# Patient Record
Sex: Female | Born: 1975 | Race: White | Hispanic: No | Smoking: Current every day smoker
Health system: Southern US, Community
[De-identification: ages and names within clinical notes are randomized; demographics above are authoritative.]

## PROBLEM LIST (undated history)

## (undated) DIAGNOSIS — E039 Hypothyroidism, unspecified: Secondary | ICD-10-CM

## (undated) DIAGNOSIS — E7841 Elevated Lipoprotein(a): Secondary | ICD-10-CM

## (undated) DIAGNOSIS — E785 Hyperlipidemia, unspecified: Secondary | ICD-10-CM

## (undated) DIAGNOSIS — F1910 Other psychoactive substance abuse, uncomplicated: Secondary | ICD-10-CM

## (undated) DIAGNOSIS — I213 ST elevation (STEMI) myocardial infarction of unspecified site: Secondary | ICD-10-CM

## (undated) DIAGNOSIS — I2510 Atherosclerotic heart disease of native coronary artery without angina pectoris: Secondary | ICD-10-CM

## (undated) DIAGNOSIS — I10 Essential (primary) hypertension: Secondary | ICD-10-CM

## (undated) DIAGNOSIS — I5022 Chronic systolic (congestive) heart failure: Secondary | ICD-10-CM

## (undated) DIAGNOSIS — N1831 Chronic kidney disease, stage 3a: Secondary | ICD-10-CM

---

## 2021-04-29 ENCOUNTER — Encounter (HOSPITAL_COMMUNITY): Payer: Self-pay | Admitting: Cardiovascular Disease

## 2021-04-29 ENCOUNTER — Inpatient Hospital Stay (HOSPITAL_COMMUNITY)
Admission: AD | Admit: 2021-04-29 | Discharge: 2021-05-02 | DRG: 280 | Disposition: A | Discharge status: Home / Self Care | Payer: Medicaid Other | Attending: Cardiovascular Disease | Admitting: Cardiovascular Disease

## 2021-04-29 ENCOUNTER — Emergency Department (HOSPITAL_COMMUNITY): Payer: Medicaid Other

## 2021-04-29 ENCOUNTER — Inpatient Hospital Stay (HOSPITAL_COMMUNITY): Payer: Medicaid Other

## 2021-04-29 ENCOUNTER — Other Ambulatory Visit: Payer: Self-pay

## 2021-04-29 ENCOUNTER — Other Ambulatory Visit (HOSPITAL_COMMUNITY): Payer: Self-pay

## 2021-04-29 ENCOUNTER — Encounter (HOSPITAL_COMMUNITY): Payer: Medicaid Other

## 2021-04-29 ENCOUNTER — Inpatient Hospital Stay (HOSPITAL_COMMUNITY)
Admission: AD | Disposition: A | Discharge status: Home / Self Care | Payer: Self-pay | Source: Home / Self Care | Attending: Cardiovascular Disease

## 2021-04-29 DIAGNOSIS — I214 Non-ST elevation (NSTEMI) myocardial infarction: Secondary | ICD-10-CM

## 2021-04-29 DIAGNOSIS — I161 Hypertensive emergency: Secondary | ICD-10-CM

## 2021-04-29 DIAGNOSIS — I255 Ischemic cardiomyopathy: Secondary | ICD-10-CM | POA: Diagnosis present

## 2021-04-29 DIAGNOSIS — I5030 Unspecified diastolic (congestive) heart failure: Secondary | ICD-10-CM

## 2021-04-29 DIAGNOSIS — I517 Cardiomegaly: Secondary | ICD-10-CM

## 2021-04-29 DIAGNOSIS — F1210 Cannabis abuse, uncomplicated: Secondary | ICD-10-CM | POA: Diagnosis present

## 2021-04-29 DIAGNOSIS — I5043 Acute on chronic combined systolic (congestive) and diastolic (congestive) heart failure: Secondary | ICD-10-CM

## 2021-04-29 DIAGNOSIS — I340 Nonrheumatic mitral (valve) insufficiency: Secondary | ICD-10-CM | POA: Diagnosis not present

## 2021-04-29 DIAGNOSIS — I169 Hypertensive crisis, unspecified: Secondary | ICD-10-CM | POA: Diagnosis not present

## 2021-04-29 DIAGNOSIS — Z8711 Personal history of peptic ulcer disease: Secondary | ICD-10-CM

## 2021-04-29 DIAGNOSIS — I213 ST elevation (STEMI) myocardial infarction of unspecified site: Secondary | ICD-10-CM | POA: Diagnosis present

## 2021-04-29 DIAGNOSIS — I5022 Chronic systolic (congestive) heart failure: Secondary | ICD-10-CM

## 2021-04-29 DIAGNOSIS — Z91040 Latex allergy status: Secondary | ICD-10-CM

## 2021-04-29 DIAGNOSIS — I2510 Atherosclerotic heart disease of native coronary artery without angina pectoris: Secondary | ICD-10-CM | POA: Diagnosis present

## 2021-04-29 DIAGNOSIS — N1831 Chronic kidney disease, stage 3a: Secondary | ICD-10-CM | POA: Diagnosis present

## 2021-04-29 DIAGNOSIS — Z20822 Contact with and (suspected) exposure to covid-19: Secondary | ICD-10-CM | POA: Diagnosis present

## 2021-04-29 DIAGNOSIS — E875 Hyperkalemia: Secondary | ICD-10-CM | POA: Diagnosis present

## 2021-04-29 DIAGNOSIS — F1910 Other psychoactive substance abuse, uncomplicated: Secondary | ICD-10-CM | POA: Diagnosis not present

## 2021-04-29 DIAGNOSIS — I5189 Other ill-defined heart diseases: Secondary | ICD-10-CM | POA: Diagnosis not present

## 2021-04-29 DIAGNOSIS — E785 Hyperlipidemia, unspecified: Secondary | ICD-10-CM

## 2021-04-29 DIAGNOSIS — Z880 Allergy status to penicillin: Secondary | ICD-10-CM | POA: Diagnosis not present

## 2021-04-29 DIAGNOSIS — F1310 Sedative, hypnotic or anxiolytic abuse, uncomplicated: Secondary | ICD-10-CM | POA: Diagnosis present

## 2021-04-29 DIAGNOSIS — F17200 Nicotine dependence, unspecified, uncomplicated: Secondary | ICD-10-CM | POA: Diagnosis present

## 2021-04-29 DIAGNOSIS — I5023 Acute on chronic systolic (congestive) heart failure: Secondary | ICD-10-CM | POA: Diagnosis present

## 2021-04-29 DIAGNOSIS — I10 Essential (primary) hypertension: Secondary | ICD-10-CM

## 2021-04-29 DIAGNOSIS — I160 Hypertensive urgency: Secondary | ICD-10-CM | POA: Diagnosis not present

## 2021-04-29 DIAGNOSIS — Z882 Allergy status to sulfonamides status: Secondary | ICD-10-CM | POA: Diagnosis not present

## 2021-04-29 DIAGNOSIS — E039 Hypothyroidism, unspecified: Secondary | ICD-10-CM

## 2021-04-29 DIAGNOSIS — Z6841 Body Mass Index (BMI) 40.0 and over, adult: Secondary | ICD-10-CM

## 2021-04-29 DIAGNOSIS — I130 Hypertensive heart and chronic kidney disease with heart failure and stage 1 through stage 4 chronic kidney disease, or unspecified chronic kidney disease: Secondary | ICD-10-CM | POA: Diagnosis present

## 2021-04-29 DIAGNOSIS — F1510 Other stimulant abuse, uncomplicated: Secondary | ICD-10-CM | POA: Diagnosis present

## 2021-04-29 DIAGNOSIS — I2129 ST elevation (STEMI) myocardial infarction involving other sites: Principal | ICD-10-CM

## 2021-04-29 DIAGNOSIS — Z8249 Family history of ischemic heart disease and other diseases of the circulatory system: Secondary | ICD-10-CM | POA: Diagnosis not present

## 2021-04-29 DIAGNOSIS — Z720 Tobacco use: Secondary | ICD-10-CM | POA: Diagnosis not present

## 2021-04-29 HISTORY — DX: Elevated lipoprotein(a): E78.41

## 2021-04-29 HISTORY — DX: Essential (primary) hypertension: I10

## 2021-04-29 HISTORY — DX: Hypothyroidism, unspecified: E03.9

## 2021-04-29 HISTORY — DX: ST elevation (STEMI) myocardial infarction of unspecified site: I21.3

## 2021-04-29 HISTORY — DX: Hyperlipidemia, unspecified: E78.5

## 2021-04-29 HISTORY — DX: Chronic systolic (congestive) heart failure: I50.22

## 2021-04-29 HISTORY — DX: Chronic kidney disease, stage 3a: N18.31

## 2021-04-29 HISTORY — DX: Morbid (severe) obesity due to excess calories: E66.01

## 2021-04-29 HISTORY — DX: Other psychoactive substance abuse, uncomplicated: F19.10

## 2021-04-29 HISTORY — DX: Atherosclerotic heart disease of native coronary artery without angina pectoris: I25.10

## 2021-04-29 HISTORY — PX: LEFT HEART CATH AND CORONARY ANGIOGRAPHY: CATH118249

## 2021-04-29 LAB — COMPREHENSIVE METABOLIC PANEL
ALT: 10 U/L (ref 0–44)
AST: 18 U/L (ref 15–41)
Albumin: 3.8 g/dL (ref 3.5–5.0)
Alkaline Phosphatase: 74 U/L (ref 38–126)
Anion gap: 10 (ref 5–15)
BUN: 14 mg/dL (ref 6–20)
CO2: 22 mmol/L (ref 22–32)
Calcium: 8.8 mg/dL — ABNORMAL LOW (ref 8.9–10.3)
Chloride: 106 mmol/L (ref 98–111)
Creatinine, Ser: 1.26 mg/dL — ABNORMAL HIGH (ref 0.44–1.00)
GFR, Estimated: 54 mL/min — ABNORMAL LOW (ref >60)
Glucose, Bld: 117 mg/dL — ABNORMAL HIGH (ref 70–99)
Potassium: 3.6 mmol/L (ref 3.5–5.1)
Sodium: 138 mmol/L (ref 135–145)
Total Bilirubin: 0.7 mg/dL (ref 0.3–1.2)
Total Protein: 7.1 g/dL (ref 6.5–8.1)

## 2021-04-29 LAB — CBC WITH DIFFERENTIAL/PLATELET
Abs Immature Granulocytes: 0.06 10*3/uL (ref 0.00–0.07)
Basophils Absolute: 0.1 10*3/uL (ref 0.0–0.1)
Basophils Relative: 1 %
Eosinophils Absolute: 0.2 10*3/uL (ref 0.0–0.5)
Eosinophils Relative: 2 %
HCT: 35.7 % — ABNORMAL LOW (ref 36.0–46.0)
Hemoglobin: 11.2 g/dL — ABNORMAL LOW (ref 12.0–15.0)
Immature Granulocytes: 1 %
Lymphocytes Relative: 13 %
Lymphs Abs: 1.6 10*3/uL (ref 0.7–4.0)
MCH: 27.3 pg (ref 26.0–34.0)
MCHC: 31.4 g/dL (ref 30.0–36.0)
MCV: 86.9 fL (ref 80.0–100.0)
Monocytes Absolute: 0.6 10*3/uL (ref 0.1–1.0)
Monocytes Relative: 5 %
Neutro Abs: 10.3 10*3/uL — ABNORMAL HIGH (ref 1.7–7.7)
Neutrophils Relative %: 78 %
Platelets: 322 10*3/uL (ref 150–400)
RBC: 4.11 MIL/uL (ref 3.87–5.11)
RDW: 16.4 % — ABNORMAL HIGH (ref 11.5–15.5)
WBC: 12.8 10*3/uL — ABNORMAL HIGH (ref 4.0–10.5)
nRBC: 0 % (ref 0.0–0.2)

## 2021-04-29 LAB — ECHOCARDIOGRAM COMPLETE
AR max vel: 3.29 cm2
AV Area VTI: 2.68 cm2
AV Area mean vel: 3.04 cm2
AV Mean grad: 3 mmHg
AV Peak grad: 5.1 mmHg
Ao pk vel: 1.13 m/s
Area-P 1/2: 4.83 cm2
Calc EF: 42.8 %
Height: 65 in
MV M vel: 6.24 m/s
MV Peak grad: 155.8 mmHg
MV VTI: 2.34 cm2
Radius: 0.4 cm
S' Lateral: 3.6 cm
Single Plane A2C EF: 39.2 %
Single Plane A4C EF: 41.6 %
Weight: 4000.03 oz

## 2021-04-29 LAB — RESP PANEL BY RT-PCR (FLU A&B, COVID) ARPGX2
Influenza A by PCR: NEGATIVE
Influenza B by PCR: NEGATIVE
SARS Coronavirus 2 by RT PCR: NEGATIVE

## 2021-04-29 LAB — BASIC METABOLIC PANEL
Anion gap: 11 (ref 5–15)
BUN: 14 mg/dL (ref 6–20)
CO2: 21 mmol/L — ABNORMAL LOW (ref 22–32)
Calcium: 9 mg/dL (ref 8.9–10.3)
Chloride: 104 mmol/L (ref 98–111)
Creatinine, Ser: 1.14 mg/dL — ABNORMAL HIGH (ref 0.44–1.00)
GFR, Estimated: >60 mL/min (ref >60)
Glucose, Bld: 93 mg/dL (ref 70–99)
Potassium: 4.2 mmol/L (ref 3.5–5.1)
Sodium: 136 mmol/L (ref 135–145)

## 2021-04-29 LAB — I-STAT BETA HCG BLOOD, ED (MC, WL, AP ONLY): I-stat hCG, quantitative: <5 m[IU]/mL (ref <5)

## 2021-04-29 LAB — LIPID PANEL
Cholesterol: 253 mg/dL — ABNORMAL HIGH (ref 0–200)
HDL: 62 mg/dL (ref >40)
LDL Cholesterol: 170 mg/dL — ABNORMAL HIGH (ref 0–99)
Total CHOL/HDL Ratio: 4.1 RATIO
Triglycerides: 107 mg/dL (ref <150)
VLDL: 21 mg/dL (ref 0–40)

## 2021-04-29 LAB — RAPID URINE DRUG SCREEN, HOSP PERFORMED
Amphetamines: POSITIVE — AB
Barbiturates: NOT DETECTED
Benzodiazepines: POSITIVE — AB
Cocaine: NOT DETECTED
Opiates: NOT DETECTED
Tetrahydrocannabinol: POSITIVE — AB

## 2021-04-29 LAB — HEMOGLOBIN A1C
Hgb A1c MFr Bld: 5.4 % (ref 4.8–5.6)
Mean Plasma Glucose: 108.28 mg/dL

## 2021-04-29 LAB — MRSA NEXT GEN BY PCR, NASAL: MRSA by PCR Next Gen: NOT DETECTED

## 2021-04-29 LAB — TROPONIN I (HIGH SENSITIVITY)
Troponin I (High Sensitivity): 17 ng/L (ref <18)
Troponin I (High Sensitivity): 330 ng/L (ref <18)
Troponin I (High Sensitivity): 597 ng/L (ref <18)
Troponin I (High Sensitivity): 1559 ng/L (ref <18)

## 2021-04-29 LAB — TSH: TSH: 120.126 u[IU]/mL — ABNORMAL HIGH (ref 0.350–4.500)

## 2021-04-29 LAB — POCT ACTIVATED CLOTTING TIME: Activated Clotting Time: 275 seconds

## 2021-04-29 LAB — MAGNESIUM: Magnesium: 2.2 mg/dL (ref 1.7–2.4)

## 2021-04-29 LAB — APTT: aPTT: 32 seconds (ref 24–36)

## 2021-04-29 LAB — PROTIME-INR
INR: 1 (ref 0.8–1.2)
Prothrombin Time: 13.2 seconds (ref 11.4–15.2)

## 2021-04-29 SURGERY — LEFT HEART CATH AND CORONARY ANGIOGRAPHY
Anesthesia: LOCAL

## 2021-04-29 MED ORDER — TICAGRELOR 90 MG PO TABS: 90.0000 mg | ORAL_TABLET | Freq: Two times a day (BID) | ORAL | Status: DC

## 2021-04-29 MED ORDER — LABETALOL HCL 5 MG/ML IV SOLN
10.0000 mg | INTRAVENOUS | Status: AC | PRN
  Filled 2021-04-29: qty 4

## 2021-04-29 MED ORDER — HEPARIN (PORCINE) IN NACL 1000-0.9 UT/500ML-% IV SOLN
INTRAVENOUS | Status: AC
  Filled 2021-04-29: qty 500

## 2021-04-29 MED ORDER — SODIUM CHLORIDE 0.9 % IV SOLN: INTRAVENOUS | Status: AC

## 2021-04-29 MED ORDER — SODIUM CHLORIDE 0.9% FLUSH: 3.0000 mL | INTRAVENOUS | Status: DC | PRN

## 2021-04-29 MED ORDER — VERAPAMIL HCL 2.5 MG/ML IV SOLN
INTRAVENOUS | Status: AC
  Filled 2021-04-29: qty 2

## 2021-04-29 MED ORDER — SODIUM CHLORIDE 0.9 % IV SOLN: INTRAVENOUS | Status: DC

## 2021-04-29 MED ORDER — ATORVASTATIN CALCIUM 80 MG PO TABS: 80.0000 mg | ORAL_TABLET | Freq: Every day | ORAL | Status: DC

## 2021-04-29 MED ORDER — NITROGLYCERIN IN D5W 200-5 MCG/ML-% IV SOLN
INTRAVENOUS | Status: AC
  Filled 2021-04-29: qty 250

## 2021-04-29 MED ORDER — FUROSEMIDE 10 MG/ML IJ SOLN
40.0000 mg | Freq: Once | INTRAMUSCULAR | Status: AC
  Administered 2021-04-29: 40 mg via INTRAVENOUS
  Filled 2021-04-29: qty 4

## 2021-04-29 MED ORDER — FENTANYL CITRATE (PF) 100 MCG/2ML IJ SOLN
INTRAMUSCULAR | Status: DC | PRN
  Administered 2021-04-29: 50 ug via INTRAVENOUS

## 2021-04-29 MED ORDER — FUROSEMIDE 10 MG/ML IJ SOLN: 40.0000 mg | Freq: Once | INTRAMUSCULAR | Status: DC

## 2021-04-29 MED ORDER — HEPARIN SODIUM (PORCINE) 5000 UNIT/ML IJ SOLN
4000.0000 [IU] | Freq: Once | INTRAMUSCULAR | Status: AC
  Administered 2021-04-29: 4000 [IU] via INTRAVENOUS
  Filled 2021-04-29: qty 1

## 2021-04-29 MED ORDER — FENTANYL CITRATE (PF) 100 MCG/2ML IJ SOLN
INTRAMUSCULAR | Status: AC
  Filled 2021-04-29: qty 2

## 2021-04-29 MED ORDER — HYDRALAZINE HCL 20 MG/ML IJ SOLN
10.0000 mg | INTRAMUSCULAR | Status: AC | PRN
  Filled 2021-04-29: qty 1

## 2021-04-29 MED ORDER — METOPROLOL TARTRATE 12.5 MG HALF TABLET: 25.0000 mg | ORAL_TABLET | Freq: Two times a day (BID) | ORAL | Status: DC

## 2021-04-29 MED ORDER — SODIUM CHLORIDE 0.9 % IV SOLN: 250.0000 mL | INTRAVENOUS | Status: DC | PRN

## 2021-04-29 MED ORDER — HEPARIN (PORCINE) IN NACL 1000-0.9 UT/500ML-% IV SOLN
INTRAVENOUS | Status: DC | PRN
  Administered 2021-04-29 (×2): 500 mL

## 2021-04-29 MED ORDER — NITROGLYCERIN 0.4 MG SL SUBL: 0.4000 mg | SUBLINGUAL_TABLET | SUBLINGUAL | Status: DC | PRN

## 2021-04-29 MED ORDER — CHLORHEXIDINE GLUCONATE CLOTH 2 % EX PADS
6.0000 | MEDICATED_PAD | Freq: Every day | CUTANEOUS | Status: DC
  Administered 2021-04-29 – 2021-04-30 (×2): 6 via TOPICAL

## 2021-04-29 MED ORDER — LOSARTAN POTASSIUM 25 MG PO TABS
25.0000 mg | ORAL_TABLET | Freq: Every day | ORAL | Status: DC
  Administered 2021-04-29 – 2021-04-30 (×2): 25 mg via ORAL
  Filled 2021-04-29 (×2): qty 1

## 2021-04-29 MED ORDER — NITROGLYCERIN IN D5W 200-5 MCG/ML-% IV SOLN
0.0000 ug/min | INTRAVENOUS | Status: DC
  Administered 2021-04-29: 20 ug/min via INTRAVENOUS

## 2021-04-29 MED ORDER — TICAGRELOR 90 MG PO TABS
ORAL_TABLET | ORAL | Status: DC | PRN
  Administered 2021-04-29: 180 mg via ORAL

## 2021-04-29 MED ORDER — CLOPIDOGREL BISULFATE 75 MG PO TABS
75.0000 mg | ORAL_TABLET | Freq: Every day | ORAL | Status: DC
Start: 2021-04-29 — End: 2021-04-29

## 2021-04-29 MED ORDER — CARVEDILOL 12.5 MG PO TABS
12.5000 mg | ORAL_TABLET | Freq: Once | ORAL | Status: AC
  Administered 2021-04-29: 12.5 mg via ORAL
  Filled 2021-04-29: qty 1

## 2021-04-29 MED ORDER — MIDAZOLAM HCL 2 MG/2ML IJ SOLN
INTRAMUSCULAR | Status: DC | PRN
Start: 2021-04-29 — End: 2021-04-29
  Administered 2021-04-29: 2 mg via INTRAVENOUS

## 2021-04-29 MED ORDER — LIDOCAINE HCL (PF) 1 % IJ SOLN
INTRAMUSCULAR | Status: AC
  Filled 2021-04-29: qty 30

## 2021-04-29 MED ORDER — ROSUVASTATIN CALCIUM 20 MG PO TABS
40.0000 mg | ORAL_TABLET | Freq: Every day | ORAL | Status: DC
  Administered 2021-04-29 – 2021-05-02 (×4): 40 mg via ORAL
  Filled 2021-04-29 (×4): qty 2

## 2021-04-29 MED ORDER — ONDANSETRON HCL 4 MG/2ML IJ SOLN
4.0000 mg | Freq: Four times a day (QID) | INTRAMUSCULAR | Status: DC | PRN
  Administered 2021-04-29: 4 mg via INTRAVENOUS
  Filled 2021-04-29: qty 2

## 2021-04-29 MED ORDER — CLOPIDOGREL BISULFATE 75 MG PO TABS
75.0000 mg | ORAL_TABLET | Freq: Every day | ORAL | Status: DC
  Administered 2021-04-29 – 2021-05-02 (×4): 75 mg via ORAL
  Filled 2021-04-29 (×4): qty 1

## 2021-04-29 MED ORDER — SODIUM CHLORIDE 0.9% FLUSH
3.0000 mL | Freq: Two times a day (BID) | INTRAVENOUS | Status: DC
  Administered 2021-04-29 – 2021-05-01 (×4): 3 mL via INTRAVENOUS

## 2021-04-29 MED ORDER — ASPIRIN 81 MG PO CHEW
81.0000 mg | CHEWABLE_TABLET | Freq: Every day | ORAL | Status: DC
  Administered 2021-04-30 – 2021-05-02 (×3): 81 mg via ORAL
  Filled 2021-04-29 (×3): qty 1

## 2021-04-29 MED ORDER — NITROGLYCERIN IN D5W 200-5 MCG/ML-% IV SOLN
INTRAVENOUS | Status: AC | PRN
  Administered 2021-04-29: 20 ug/min via INTRAVENOUS

## 2021-04-29 MED ORDER — ACETAMINOPHEN 325 MG PO TABS
650.0000 mg | ORAL_TABLET | ORAL | Status: DC | PRN
  Administered 2021-04-29 – 2021-05-02 (×3): 650 mg via ORAL
  Filled 2021-04-29 (×3): qty 2

## 2021-04-29 MED ORDER — CARVEDILOL 12.5 MG PO TABS
12.5000 mg | ORAL_TABLET | Freq: Two times a day (BID) | ORAL | Status: DC
  Administered 2021-04-29 – 2021-04-30 (×4): 12.5 mg via ORAL
  Filled 2021-04-29 (×5): qty 1

## 2021-04-29 MED ORDER — TICAGRELOR 90 MG PO TABS
ORAL_TABLET | ORAL | Status: AC
  Filled 2021-04-29: qty 2

## 2021-04-29 MED ORDER — HEPARIN SODIUM (PORCINE) 1000 UNIT/ML IJ SOLN
INTRAMUSCULAR | Status: AC
  Filled 2021-04-29: qty 10

## 2021-04-29 MED ORDER — MIDAZOLAM HCL 2 MG/2ML IJ SOLN
INTRAMUSCULAR | Status: AC
Start: 2021-04-29 — End: ?
  Filled 2021-04-29: qty 2

## 2021-04-29 MED ORDER — HEPARIN SODIUM (PORCINE) 1000 UNIT/ML IJ SOLN
INTRAMUSCULAR | Status: DC | PRN
  Administered 2021-04-29: 8000 [IU] via INTRAVENOUS

## 2021-04-29 MED ORDER — ASPIRIN 81 MG PO CHEW
324.0000 mg | CHEWABLE_TABLET | Freq: Once | ORAL | Status: AC
  Administered 2021-04-29: 162 mg via ORAL
  Filled 2021-04-29: qty 4

## 2021-04-29 SURGICAL SUPPLY — 12 items
BAND ZEPHYR COMPRESS 30 LONG (HEMOSTASIS) ×1 IMPLANT
CATH 5FR JL3.5 JR4 ANG PIG MP (CATHETERS) ×1 IMPLANT
CATH VISTA GUIDE 6FR XB3 (CATHETERS) ×1 IMPLANT
GLIDESHEATH SLEND SS 6F .021 (SHEATH) ×1 IMPLANT
GUIDEWIRE INQWIRE 1.5J.035X260 (WIRE) IMPLANT
INQWIRE 1.5J .035X260CM (WIRE) ×2
KIT ENCORE 26 ADVANTAGE (KITS) ×1 IMPLANT
KIT HEART LEFT (KITS) ×2 IMPLANT
PACK CARDIAC CATHETERIZATION (CUSTOM PROCEDURE TRAY) ×2 IMPLANT
TRANSDUCER W/STOPCOCK (MISCELLANEOUS) ×2 IMPLANT
TUBING CIL FLEX 10 FLL-RA (TUBING) ×2 IMPLANT
VALVE COPILOT STAT (MISCELLANEOUS) ×1 IMPLANT

## 2021-04-29 NOTE — ED Triage Notes (Signed)
Pt arrived via [facility] from home with c/c of CP. Per EMS Chest Pain started 2 hours ago and couldn't sleep due to pain. Pt taken 2 ASA and given 4 nitro and 4mg  zofran. Deneis releif with nitro. Describes chest pain radiating from chest to back ? ?188/100, 96% 4L, 59HR ?

## 2021-04-29 NOTE — Plan of Care (Signed)
Pt admitted to 2H01 from cath lab, pt AO4, pain free, arrived on nitro gtt, hypertensive, 2L [place]. ?

## 2021-04-29 NOTE — Progress Notes (Addendum)
?The patient has been seen in conjunction with Marolyn Haller, MD. All aspects of care have been considered and discussed. The patient has been personally interviewed, examined, and all clinical data has been reviewed. ? ?Needs 2D Doppler echocardiogram to assess LV function.  This will help guide therapy. ?Blood pressure control should include ARB and beta-blocker.  We will see how she does on moderate dose beta-blocker therapy systolic. ?Agree she does not need Brilinta but with use dual antiplatelet therapy in the form of aspirin and Plavix given her acute infarction. ?If she tolerates beta-blocker therapy, would institute low-dose ARB and titrate based on current kidney function. ?Check kidney function in a.m. ?Discontinue tobacco and marijuana use. ?High intensity statin therapy. ? ? ? ? ? ?Progress Note ? ?Patient Name: Wendy Hodge ?Date of Encounter: 04/29/2021 ? ?[facility] Cardiologist: None  ? ?Subjective  ? ?No chest pain at bedside this AM or shortness of breath.  ? ?Inpatient Medications  ?  ?Scheduled Meds: ? [START ON 04/30/2021] aspirin  81 mg Oral Daily  ? carvedilol  12.5 mg Oral BID WC  ? furosemide  40 mg Intravenous Once  ? rosuvastatin  40 mg Oral Daily  ? sodium chloride flush  3 mL Intravenous Q12H  ? ticagrelor  90 mg Oral BID  ? ?Continuous Infusions: ? sodium chloride 20 mL/hr at 04/29/21 0421  ? sodium chloride 50 mL/hr at 04/29/21 0900  ? sodium chloride    ? nitroGLYCERIN 70 mcg/min (04/29/21 0900)  ? ?PRN Meds: ?sodium chloride, acetaminophen, hydrALAZINE, labetalol, nitroGLYCERIN, ondansetron (ZOFRAN) IV, sodium chloride flush  ? ?Vital Signs  ?  ?Vitals:  ? 04/29/21 8527 04/29/21 7824 04/29/21 2353 04/29/21 6144  ?BP:      ?Pulse: 96 89 87 93  ?Resp: 17 14 13 17   ?Temp:      ?TempSrc:      ?SpO2: 97% 97% 96% 96%  ?Weight:      ?Height:      ? ? ?Intake/Output Summary (Last 24 hours) at 04/29/2021 1027 ?Last data filed at 04/29/2021 0900 ?Gross per 24 hour  ?Intake 142.68 ml   ?Output 500 ml  ?Net -357.32 ml  ? ? ?  04/29/2021  ?  8:00 AM 04/29/2021  ?  4:09 AM  ?Last 3 Weights  ?Weight (lbs) 250 lb 250 lb  ?Weight (kg) 113.4 kg 113.399 kg  ?   ? ?Telemetry  ?  ?NSR - Personally Reviewed ? ?ECG  ?  ?aVL, V2-4 mild st elevation, improved from admission, III, avF w/ st depression - Personally Reviewed ? ?Physical Exam  ?GEN: No acute distress.   ?Neck: No JVD ?Cardiac: RRR, no murmurs, rubs, or gallops.  ?Respiratory: Clear to auscultation bilaterally. ?GI: Soft, nontender, non-distended  ?MS: 2+ LE edema; No deformity. ?Neuro:  Nonfocal  ?Psych: Normal affect  ? ?Labs  ?  ?High Sensitivity Troponin:   ?Recent Labs  ?Lab 04/29/21 ?0415  ?TROPONINIHS 17  ?   ?Chemistry ?Recent Labs  ?Lab 04/29/21 ?0415  ?NA 138  ?K 3.6  ?CL 106  ?CO2 22  ?GLUCOSE 117*  ?BUN 14  ?CREATININE 1.26*  ?CALCIUM 8.8*  ?PROT 7.1  ?ALBUMIN 3.8  ?AST 18  ?ALT 10  ?ALKPHOS 74  ?BILITOT 0.7  ?GFRNONAA 54*  ?ANIONGAP 10  ?  ?Lipids  ?Recent Labs  ?Lab 04/29/21 ?0415  ?CHOL 253*  ?TRIG 107  ?HDL 62  ?LDLCALC 170*  ?CHOLHDL 4.1  ?  ?Hematology ?Recent Labs  ?Lab 04/29/21 ?0415  ?  WBC 12.8*  ?RBC 4.11  ?HGB 11.2*  ?HCT 35.7*  ?MCV 86.9  ?MCH 27.3  ?MCHC 31.4  ?RDW 16.4*  ?PLT 322  ? ?Thyroid No results for input(s): TSH, FREET4 in the last 168 hours.  ?BNPNo results for input(s): BNP, PROBNP in the last 168 hours.  ?DDimer No results for input(s): DDIMER in the last 168 hours.  ? ?Radiology  ?  ?CARDIAC CATHETERIZATION ? ?Result Date: 04/29/2021 ?  RPAV lesion is 50% stenosed.   Mid Cx lesion is 50% stenosed.   Prox RCA lesion is 50% stenosed.   Ramus lesion is 80% stenosed.   1st Diag lesion is 60% stenosed.   2nd Diag lesion is 100% stenosed. Large caliber LAD that courses to the apex with no obstructive disease. The small caliber first diagonal branch has moderate disease but excellent flow. There appears to be calcification in the area of a second diagonal branch which could represent flush occlusion of this vessel but no  dye staining noted. The Circumflex gives off a small intermediate branch, a large obtuse marginal branch and then continues as a moderate caliber vessel in the AV groove. The intermediate branch has a moderately severe ostial stenosis but this vessel is too small for PCI. The obtuse marginal is patent and has no obstructive disease. The mid Circumflex has a moderate stenosis. The RCA is a large dominant vessel with moderate mid stenosis. The posterolateral artery and the PDA are patent. No obvious occlusions of the small distal branches. Elevated LVEDP Hypertensive urgency (LV190/51/34  AO 170/112) Recommendations: Will admit to the ICU. No clear coronary culprit for her presentation. She does have moderate underlying CAD and is presenting with hypertensive urgency. She is mostly pain free at the conclusion of the cath. Will continue ASA and Brilinta for now. Will begin metoprolol and high intensity statin. We have started an IV NTG infusion in the cath lab for BP control. Will add Lisinopril today and start IV Lasix. Echo will be arranged. I suspect that with better BP control and diuresis she will feel better.  ? ?DG Chest Port 1 View ? ?Result Date: 04/29/2021 ?CLINICAL DATA:  46 year old female with chest pain. Pain under arm. Diaphoresis. EXAM: PORTABLE CHEST 1 VIEW COMPARISON:  07/16/2007. FINDINGS: Portable AP semi upright view at 0434 hours. Mildly lower lung volumes. Mediastinal contours remain within normal limits. Visualized tracheal air column is within normal limits. Allowing for portable technique the lungs are clear. No pneumothorax or pleural effusion. No osseous abnormality identified. Negative visible bowel gas. IMPRESSION: Negative portable chest. Electronically Signed   By: Genevie Ann M.D.   On: 04/29/2021 04:51  ? ?ECHOCARDIOGRAM COMPLETE ? ?Result Date: 04/29/2021 ?   ECHOCARDIOGRAM REPORT   Patient Name:   Wendy Hodge Date of Exam: 04/29/2021 Medical Rec #:  [id number]    Height:       65.0 in  Accession #:    [id number]   Weight:       250.0 lb Date of Birth:  [birth year]    BSA:          2.175 m? Patient Age:    46 years     BP:           160/106 mmHg Patient Gender: F            HR:           83 bpm. Exam Location:  Inpatient Procedure: 2D Echo, Cardiac Doppler, Color Doppler and Strain Analysis Indications:  CAD  History:        Patient has no prior history of Echocardiogram examinations.  Sonographer:    Luisa Hart RDCS Referring Phys: [place]  Sonographer Comments: Suboptimal parasternal window, suboptimal apical window, suboptimal subcostal window and patient is morbidly obese. Image acquisition challenging due to patient body habitus. IMPRESSIONS  1. Left ventricular ejection fraction, by estimation, is 45 to 50%. The left ventricle has mildly decreased function. The left ventricle demonstrates global hypokinesis. There is moderate concentric left ventricular hypertrophy. Left ventricular diastolic parameters are consistent with Grade I diastolic dysfunction (impaired relaxation).  2. Right ventricular systolic function is normal. The right ventricular size is normal.  3. The mitral valve is normal in structure. Mild mitral valve regurgitation. No evidence of mitral stenosis.  4. The aortic valve is normal in structure. Aortic valve regurgitation is not visualized. No aortic stenosis is present.  5. The inferior vena cava is normal in size with greater than 50% respiratory variability, suggesting right atrial pressure of 3 mmHg. Comparison(s): No prior Echocardiogram. FINDINGS  Left Ventricle: Left ventricular ejection fraction, by estimation, is 45 to 50%. The left ventricle has mildly decreased function. The left ventricle demonstrates global hypokinesis. Global longitudinal strain performed but not reported based on interpreter judgement due to suboptimal tracking. The left ventricular internal cavity size was normal in size. There is moderate concentric left ventricular  hypertrophy. Left ventricular diastolic parameters are consistent with Grade I diastolic dysfunction (impaired relaxation). Right Ventricle: The right ventricular size is normal. No increase in right ventricular
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
## 2021-04-29 NOTE — TOC Benefit Eligibility Note (Signed)
Patient Advocate Encounter  Insurance verification completed.    The patient is currently admitted and upon discharge could be taking Brilinta 90 mg.  The current 30 day co-pay is, $4.00.   The patient is insured through [employer]  Medicaid     Daylan Boggess, CPhT Pharmacy Patient Advocate Specialist [facility] Patient Advocate Team Direct Number: [phone]  Fax: [phone]        

## 2021-04-29 NOTE — Progress Notes (Signed)
VASCULAR LAB ? ? ? ?ABIs have been performed. ? ?See CV proc for preliminary results. ? ? ?Kamerin Axford, RVT ?04/29/2021, 1:53 PM ? ?
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
## 2021-04-30 ENCOUNTER — Other Ambulatory Visit (HOSPITAL_COMMUNITY): Payer: Self-pay

## 2021-04-30 DIAGNOSIS — Z720 Tobacco use: Secondary | ICD-10-CM

## 2021-04-30 DIAGNOSIS — F1910 Other psychoactive substance abuse, uncomplicated: Secondary | ICD-10-CM

## 2021-04-30 LAB — CBC
HCT: 34.5 % — ABNORMAL LOW (ref 36.0–46.0)
Hemoglobin: 11 g/dL — ABNORMAL LOW (ref 12.0–15.0)
MCH: 26.5 pg (ref 26.0–34.0)
MCHC: 31.9 g/dL (ref 30.0–36.0)
MCV: 83.1 fL (ref 80.0–100.0)
Platelets: 371 10*3/uL (ref 150–400)
RBC: 4.15 MIL/uL (ref 3.87–5.11)
RDW: 16.3 % — ABNORMAL HIGH (ref 11.5–15.5)
WBC: 10.3 10*3/uL (ref 4.0–10.5)
nRBC: 0 % (ref 0.0–0.2)

## 2021-04-30 LAB — BASIC METABOLIC PANEL
Anion gap: 10 (ref 5–15)
BUN: 17 mg/dL (ref 6–20)
CO2: 23 mmol/L (ref 22–32)
Calcium: 9.2 mg/dL (ref 8.9–10.3)
Chloride: 100 mmol/L (ref 98–111)
Creatinine, Ser: 1.28 mg/dL — ABNORMAL HIGH (ref 0.44–1.00)
GFR, Estimated: 53 mL/min — ABNORMAL LOW (ref >60)
Glucose, Bld: 107 mg/dL — ABNORMAL HIGH (ref 70–99)
Potassium: 4 mmol/L (ref 3.5–5.1)
Sodium: 133 mmol/L — ABNORMAL LOW (ref 135–145)

## 2021-04-30 LAB — TROPONIN I (HIGH SENSITIVITY)
Troponin I (High Sensitivity): 17309 ng/L (ref <18)
Troponin I (High Sensitivity): 15012 ng/L (ref <18)

## 2021-04-30 LAB — LIPOPROTEIN A (LPA): Lipoprotein (a): 203.7 nmol/L — ABNORMAL HIGH (ref <75.0)

## 2021-04-30 MED ORDER — PANTOPRAZOLE SODIUM 40 MG PO TBEC
40.0000 mg | DELAYED_RELEASE_TABLET | Freq: Every day | ORAL | Status: DC
  Administered 2021-05-01 – 2021-05-02 (×2): 40 mg via ORAL
  Filled 2021-04-30 (×2): qty 1

## 2021-04-30 MED ORDER — LOSARTAN POTASSIUM 50 MG PO TABS: 50.0000 mg | ORAL_TABLET | Freq: Every day | ORAL | Status: DC

## 2021-04-30 MED FILL — Lidocaine HCl Local Preservative Free (PF) Inj 1%: INTRAMUSCULAR | Qty: 30 | Status: AC

## 2021-04-30 MED FILL — Verapamil HCl IV Soln 2.5 MG/ML: INTRAVENOUS | Qty: 2 | Status: AC
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
## 2021-04-30 NOTE — TOC Benefit Eligibility Note (Signed)
Patient Advocate Encounter ?  ?Received notification that prior authorization for Farxiga 10 mg is required. ?  ?PA submitted on 04/30/2021 ?Key [id number] ?Status is pending ?   ? ? ? ?Roland Earl, CPhT ?Pharmacy Patient Advocate Specialist ?[facility] Patient Advocate Team ?Direct Number: [phone]  Fax: [phone]  ?
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
## 2021-04-30 NOTE — TOC Benefit Eligibility Note (Signed)
Patient Advocate Encounter ? ?Prior Authorization for Farxiga 10 mg has been approved.   ? ?PA# [id number] ?Effective dates: 04/30/2021 through 05/01/2022 ? ?Patients co-pay is $4.00.  ? ? ? ?Lyndel Safe, CPhT ?Pharmacy Patient Advocate Specialist ?[facility] Patient Advocate Team ?Direct Number: [phone]  Fax: [phone]  ?

## 2021-04-30 NOTE — TOC Benefit Eligibility Note (Addendum)
Patient Advocate Encounter ? ?Insurance verification completed.   ? ?The patient is currently admitted and upon discharge could be taking Farxiga 10 mg. ? ?Prior Authorization Required  ? ?The patient is currently admitted and upon discharge could be taking Jardiance 10 mg. ? ?Prior Authorization Required  ? ?The patient is insured through [place]  ? ? ?Roland Earl, CPhT ?Pharmacy Patient Advocate Specialist ?[facility] Patient Advocate Team ?Direct Number: [phone]  Fax: [phone] ? ? ? ? ? ?  ?

## 2021-04-30 NOTE — Progress Notes (Signed)
CSW received consult for substance use resources. CSW spoke with patient at bedside. CSW offered patient outpatient substance use treatment services resources. Patient declined. All questions answered. No further questions reported at this time. ?

## 2021-05-01 LAB — T4, FREE: Free T4: <0.25 ng/dL — ABNORMAL LOW (ref 0.61–1.12)

## 2021-05-01 MED ORDER — CARVEDILOL 25 MG PO TABS
25.0000 mg | ORAL_TABLET | Freq: Once | ORAL | Status: AC
  Administered 2021-05-01: 25 mg via ORAL
  Filled 2021-05-01: qty 1

## 2021-05-01 MED ORDER — LEVOTHYROXINE SODIUM 25 MCG PO TABS
25.0000 ug | ORAL_TABLET | Freq: Every day | ORAL | Status: DC
Start: 2021-05-01 — End: 2021-05-02
  Administered 2021-05-01 – 2021-05-02 (×2): 25 ug via ORAL
  Filled 2021-05-01 (×2): qty 1

## 2021-05-01 MED ORDER — LOSARTAN POTASSIUM 50 MG PO TABS
100.0000 mg | ORAL_TABLET | Freq: Every day | ORAL | Status: DC
Start: 2021-05-01 — End: 2021-05-02
  Administered 2021-05-01: 100 mg via ORAL
  Filled 2021-05-01 (×2): qty 2

## 2021-05-01 MED ORDER — CARVEDILOL 25 MG PO TABS
25.0000 mg | ORAL_TABLET | Freq: Two times a day (BID) | ORAL | Status: DC
  Administered 2021-05-01 – 2021-05-02 (×2): 25 mg via ORAL
  Filled 2021-05-01 (×2): qty 1

## 2021-05-01 NOTE — Progress Notes (Addendum)
? ?Progress Note ? ?Patient Name: Wendy Hodge ?Date of Encounter: 05/01/2021 ? ?Primary Cardiologist: Lesleigh Noe, MD ? ?Subjective  ? ?Feeling fair, no complaints. No CP or dyspnea. ? ?Inpatient Medications  ?  ?Scheduled Meds: ? aspirin  81 mg Oral Daily  ? carvedilol  25 mg Oral BID WC  ? carvedilol  25 mg Oral Once  ? Chlorhexidine Gluconate Cloth  6 each Topical Daily  ? clopidogrel  75 mg Oral Daily  ? losartan  100 mg Oral Daily  ? pantoprazole  40 mg Oral Daily  ? rosuvastatin  40 mg Oral Daily  ? sodium chloride flush  3 mL Intravenous Q12H  ? ?Continuous Infusions: ? sodium chloride Stopped (04/29/21 1739)  ? sodium chloride    ? ?PRN Meds: ?sodium chloride, acetaminophen, nitroGLYCERIN, ondansetron (ZOFRAN) IV, sodium chloride flush  ? ?Vital Signs  ?  ?Vitals:  ? 04/30/21 1433 04/30/21 1628 04/30/21 2011 05/01/21 0507  ?BP: 126/83 140/86 140/79 (!) 161/89  ?Pulse: 75 75 (!) 115 78  ?Resp: 18  16 16   ?Temp: 98.9 ?F (37.2 ?C)  98 ?F (36.7 ?C) 98.9 ?F (37.2 ?C)  ?TempSrc: Oral  Oral Oral  ?SpO2: 99%  95% 98%  ?Weight: 103.4 kg     ?Height: 5\' 5"  (1.651 m)     ? ?No intake or output data in the 24 hours ending 05/01/21 1041 ? ?  04/30/2021  ?  2:33 PM 04/29/2021  ?  8:00 AM 04/29/2021  ?  4:09 AM  ?Last 3 Weights  ?Weight (lbs) 228 lb 250 lb 250 lb  ?Weight (kg) 103.42 kg 113.4 kg 113.399 kg  ?  ? ?Telemetry  ?  ?NSR - Personally Reviewed ? ?ECG  ?  ?No new tracings - Personally Reviewed ? ?Physical Exam  ? ?GEN: No acute distress.  ?HEENT: Normocephalic, atraumatic, sclera non-icteric. ?Neck: No JVD or bruits. ?Cardiac: RRR no murmurs, rubs, or gallops.  ?Respiratory: Clear to auscultation bilaterally. Breathing is unlabored. ?GI: Soft, nontender, non-distended, BS +x 4. ?MS: no deformity.  ?Extremities: No clubbing or cyanosis. Mild soft bilateral LE edema. Distal pedal pulses are 2+ and equal bilaterally.Right radial cath site without hematoma or ecchymosis; good pulse. ?Neuro:  AAOx3. Follows  commands. ?Psych:  Responds to questions appropriately with a normal affect. ? ?Labs  ?  ?High Sensitivity Troponin:   ?Recent Labs  ?Lab 04/29/21 ?05/01/2021 04/29/21 ?1035 04/29/21 ?1250 04/30/21 ?1020 04/30/21 ?1225  ?TROPONINIHS 330* 597* 1,559* 17,309* 15,012*  ?   ? ?Cardiac EnzymesNo results for input(s): TROPONINI in the last 168 hours. No results for input(s): TROPIPOC in the last 168 hours.  ? ?Chemistry ?Recent Labs  ?Lab 04/29/21 ?0415 04/29/21 ?1035 04/30/21 ?0112  ?NA 138 136 133*  ?K 3.6 4.2 4.0  ?CL 106 104 100  ?CO2 22 21* 23  ?GLUCOSE 117* 93 107*  ?BUN 14 14 17   ?CREATININE 1.26* 1.14* 1.28*  ?CALCIUM 8.8* 9.0 9.2  ?PROT 7.1  --   --   ?ALBUMIN 3.8  --   --   ?AST 18  --   --   ?ALT 10  --   --   ?ALKPHOS 74  --   --   ?BILITOT 0.7  --   --   ?GFRNONAA 54* >60 53*  ?ANIONGAP 10 11 10   ?  ? ?Hematology ?Recent Labs  ?Lab 04/29/21 ?0415 04/30/21 ?0112  ?WBC 12.8* 10.3  ?RBC 4.11 4.15  ?HGB 11.2* 11.0*  ?HCT 35.7* 34.5*  ?  MCV 86.9 83.1  ?MCH 27.3 26.5  ?MCHC 31.4 31.9  ?RDW 16.4* 16.3*  ?PLT 322 371  ? ? ?BNPNo results for input(s): BNP, PROBNP in the last 168 hours.  ? ?DDimer No results for input(s): DDIMER in the last 168 hours.  ? ?Radiology  ?  ?VAS [place] ABI WITH/WO TBI ? ?Result Date: 04/29/2021 ? LOWER EXTREMITY DOPPLER STUDY Patient Name:  Wendy Hodge  Date of Exam:   04/29/2021 Medical Rec #: [id number]     Accession #:    [id number] Date of Birth: 03-11-1975     Patient Gender: F Patient Age:   54 years Exam Location:  [facility] Procedure:      VAS [place] ABI WITH/WO TBI Referring Phys: Verne Carrow --------------------------------------------------------------------------------  High Risk Factors: Hypertension, current smoker.  Comparison Study: No prior study on file Performing Technologist: Sherren Kerns RVS  Examination Guidelines: A complete evaluation includes at minimum, Doppler waveform signals and systolic blood pressure reading at the level of bilateral brachial, anterior  tibial, and posterior tibial arteries, when vessel segments are accessible. Bilateral testing is considered an integral part of a complete examination. Photoelectric Plethysmograph (PPG) waveforms and toe systolic pressure readings are included as required and additional duplex testing as needed. Limited examinations for reoccurring indications may be performed as noted.  ABI Findings: +---------+------------------+-----+---------+--------+ Right    Rt Pressure (mmHg)IndexWaveform Comment  +---------+------------------+-----+---------+--------+ Brachial 122                    triphasic         +---------+------------------+-----+---------+--------+ PTA      177               1.45 triphasic         +---------+------------------+-----+---------+--------+ DP       198               1.62 triphasic         +---------+------------------+-----+---------+--------+ Great Toe173               1.42                   +---------+------------------+-----+---------+--------+ +---------+------------------+-----+---------+---------------------------+ Left     Lt Pressure (mmHg)IndexWaveform Comment                     +---------+------------------+-----+---------+---------------------------+ Brachial                                 restricted post radial cath +---------+------------------+-----+---------+---------------------------+ PTA      166               1.36 triphasic                            +---------+------------------+-----+---------+---------------------------+ PERO     156               1.28 triphasic                            +---------+------------------+-----+---------+---------------------------+ Great Toe181               1.48                                      +---------+------------------+-----+---------+---------------------------+ +-------+-----------+-----------+------------+------------+ ABI/TBIToday's ABIToday's  TBIPrevious ABIPrevious TBI  +-------+-----------+-----------+------------+------------+ Right  1.62       1.42                                +-------+-----------+-----------+------------+------------+ Left   1.36       1.48                                +-------+-----------+-----------+------------+------------+ Arterial wall calcification precludes accurate ankle pressures and ABIs.  Summary: Right: Resting right ankle-brachial index indicates noncompressible right lower extremity arteries. The right toe-brachial index is normal. Left: Resting left ankle-brachial index indicates noncompressible left lower extremity arteries. The left toe-brachial index is normal.  *See table(s) above for measurements and observations.  Electronically signed by Heath Larkhomas Hawken on 04/29/2021 at 5:54:59 PM.    Final    ? ?Cardiac Studies  ? ?Cath 04/29/21 ?  RPAV lesion is 50% stenosed. ?  Mid Cx lesion is 50% stenosed. ?  Prox RCA lesion is 50% stenosed. ?  Ramus lesion is 80% stenosed. ?  1st Diag lesion is 60% stenosed. ?  2nd Diag lesion is 100% stenosed. ?  ?Large caliber LAD that courses to the apex with no obstructive disease. The small caliber first diagonal branch has moderate disease but excellent flow. There appears to be calcification in the area of a second diagonal branch which could represent flush occlusion of this vessel but no dye staining noted.  ?The Circumflex gives off a small intermediate branch, a large obtuse marginal branch and then continues as a moderate caliber vessel in the AV groove. The intermediate branch has a moderately severe ostial stenosis but this vessel is too small for PCI. The obtuse marginal is patent and has no obstructive disease. The mid Circumflex has a moderate stenosis.  ?The RCA is a large dominant vessel with moderate mid stenosis. The posterolateral artery and the PDA are patent. No obvious occlusions of the small distal branches.  ?Elevated LVEDP ?Hypertensive urgency (LV190/51/34  AO 170/112) ?   ?Recommendations: Will admit to the ICU. No clear coronary culprit for her presentation. She does have moderate underlying CAD and is presenting with hypertensive urgency. She is mostly pain free at the conclusion of the cath. Will continue ASA and Brilinta for now. Will begin metoprolol an

## 2021-05-01 NOTE — Progress Notes (Signed)
CARDIAC REHAB PHASE I  ? ?PRE:  Rate/Rhythm: 76 SR ? ?BP:  Sitting: 127/80  ?  ?  SaO2: 96 RA ? ?MODE:  Ambulation: 470 ft  ? ?POST:  Rate/Rhythm: 104 ST ? ?BP:  Sitting: 146/86   ? ?  SaO2: 99 RA ? ?Pt tolerated exercise well and amb 470 ft with independently. Pt denies CP, SOB, or dizziness throughout walk. Reviewed importance of meds, NTG use, and smoking cessation. Will continue to follow. ? ?[phone] ?Joya San, MS, ACSM-CEP ?05/01/2021 ?9:36 AM ? ?   ?

## 2021-05-02 ENCOUNTER — Encounter (HOSPITAL_COMMUNITY): Payer: Self-pay | Admitting: Cardiovascular Disease

## 2021-05-02 DIAGNOSIS — F1910 Other psychoactive substance abuse, uncomplicated: Secondary | ICD-10-CM

## 2021-05-02 DIAGNOSIS — N1831 Chronic kidney disease, stage 3a: Secondary | ICD-10-CM

## 2021-05-02 DIAGNOSIS — I10 Essential (primary) hypertension: Secondary | ICD-10-CM

## 2021-05-02 DIAGNOSIS — E785 Hyperlipidemia, unspecified: Secondary | ICD-10-CM

## 2021-05-02 DIAGNOSIS — I161 Hypertensive emergency: Secondary | ICD-10-CM

## 2021-05-02 DIAGNOSIS — I2510 Atherosclerotic heart disease of native coronary artery without angina pectoris: Secondary | ICD-10-CM

## 2021-05-02 DIAGNOSIS — I5022 Chronic systolic (congestive) heart failure: Secondary | ICD-10-CM

## 2021-05-02 DIAGNOSIS — E039 Hypothyroidism, unspecified: Secondary | ICD-10-CM

## 2021-05-02 DIAGNOSIS — I255 Ischemic cardiomyopathy: Secondary | ICD-10-CM

## 2021-05-02 LAB — CBC
HCT: 35.4 % — ABNORMAL LOW (ref 36.0–46.0)
Hemoglobin: 11.1 g/dL — ABNORMAL LOW (ref 12.0–15.0)
MCH: 27.1 pg (ref 26.0–34.0)
MCHC: 31.4 g/dL (ref 30.0–36.0)
MCV: 86.3 fL (ref 80.0–100.0)
Platelets: 319 10*3/uL (ref 150–400)
RBC: 4.1 MIL/uL (ref 3.87–5.11)
RDW: 16.4 % — ABNORMAL HIGH (ref 11.5–15.5)
WBC: 8 10*3/uL (ref 4.0–10.5)
nRBC: 0 % (ref 0.0–0.2)

## 2021-05-02 LAB — POTASSIUM: Potassium: 3.9 mmol/L (ref 3.5–5.1)

## 2021-05-02 LAB — BASIC METABOLIC PANEL
Anion gap: 12 (ref 5–15)
BUN: 20 mg/dL (ref 6–20)
CO2: 23 mmol/L (ref 22–32)
Calcium: 9.2 mg/dL (ref 8.9–10.3)
Chloride: 104 mmol/L (ref 98–111)
Creatinine, Ser: 1.32 mg/dL — ABNORMAL HIGH (ref 0.44–1.00)
GFR, Estimated: 51 mL/min — ABNORMAL LOW (ref >60)
Glucose, Bld: 96 mg/dL (ref 70–99)
Potassium: 5.2 mmol/L — ABNORMAL HIGH (ref 3.5–5.1)
Sodium: 139 mmol/L (ref 135–145)

## 2021-05-02 MED ORDER — CLOPIDOGREL BISULFATE 75 MG PO TABS: 75.0000 mg | ORAL_TABLET | Freq: Every day | ORAL | 5 refills | Status: AC

## 2021-05-02 MED ORDER — LEVOTHYROXINE SODIUM 25 MCG PO TABS: 25.0000 ug | ORAL_TABLET | Freq: Every day | ORAL | 1 refills | Status: DC

## 2021-05-02 MED ORDER — PANTOPRAZOLE SODIUM 40 MG PO TBEC: 40.0000 mg | DELAYED_RELEASE_TABLET | Freq: Every day | ORAL | 1 refills | Status: DC

## 2021-05-02 MED ORDER — LOSARTAN POTASSIUM 50 MG PO TABS
100.0000 mg | ORAL_TABLET | Freq: Every day | ORAL | Status: DC
  Administered 2021-05-02: 100 mg via ORAL

## 2021-05-02 MED ORDER — LOSARTAN POTASSIUM 100 MG PO TABS: 100.0000 mg | ORAL_TABLET | Freq: Every day | ORAL | 5 refills | Status: DC

## 2021-05-02 MED ORDER — ROSUVASTATIN CALCIUM 40 MG PO TABS: 40.0000 mg | ORAL_TABLET | Freq: Every day | ORAL | 5 refills | Status: AC

## 2021-05-02 MED ORDER — LEVOTHYROXINE SODIUM 25 MCG PO TABS: 25.0000 ug | ORAL_TABLET | Freq: Every day | ORAL | 1 refills | Status: AC

## 2021-05-02 MED ORDER — NITROGLYCERIN 0.4 MG SL SUBL
0.4000 mg | SUBLINGUAL_TABLET | SUBLINGUAL | 3 refills | Status: AC | PRN
Start: 2021-05-02 — End: ?

## 2021-05-02 MED ORDER — CARVEDILOL 25 MG PO TABS: 25.0000 mg | ORAL_TABLET | Freq: Two times a day (BID) | ORAL | 5 refills | Status: DC

## 2021-05-02 MED ORDER — ASPIRIN EC 81 MG PO TBEC: 81.0000 mg | DELAYED_RELEASE_TABLET | Freq: Every day | ORAL | 5 refills | Status: AC

## 2021-05-02 MED ORDER — LIVING BETTER WITH HEART FAILURE BOOK: Freq: Once | Status: DC

## 2021-05-02 NOTE — Discharge Summary (Signed)
?Discharge Summary  ?  ?Patient ID: Wendy Hodge ?MRN: [id number]; DOB: September 27, 1975 ? ?Admit date: 04/29/2021 ?Discharge date: 05/02/2021 ? ?PCP:  Pcp, No ?  ?[facility] Providers ?Cardiologist:  Sinclair Grooms, MD      ? ? ?Discharge Diagnoses  ?  ?Principal Problem: ?  Acute ST elevation myocardial infarction (STEMI) of lateral wall (HCC) ?Active Problems: ?  CAD (coronary artery disease) ?  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  Ischemic cardiomyopathy ?  Essential hypertension ?  Hypertensive emergency ?  Hypothyroidism ?  Stage 3a chronic kidney disease (CKD) ([place]) ?  Hyperlipidemia LDL goal <70 ?  Substance abuse ([place]) ? ? ? ?Diagnostic Studies/Procedures  ?  ?Cath 04/29/21 ?  RPAV lesion is 50% stenosed. ?  Mid Cx lesion is 50% stenosed. ?  Prox RCA lesion is 50% stenosed. ?  Ramus lesion is 80% stenosed. ?  1st Diag lesion is 60% stenosed. ?  2nd Diag lesion is 100% stenosed. ?  ?Large caliber LAD that courses to the apex with no obstructive disease. The small caliber first diagonal branch has moderate disease but excellent flow. There appears to be calcification in the area of a second diagonal branch which could represent flush occlusion of this vessel but no dye staining noted.  ?The Circumflex gives off a small intermediate branch, a large obtuse marginal branch and then continues as a moderate caliber vessel in the AV groove. The intermediate branch has a moderately severe ostial stenosis but this vessel is too small for PCI. The obtuse marginal is patent and has no obstructive disease. The mid Circumflex has a moderate stenosis.  ?The RCA is a large dominant vessel with moderate mid stenosis. The posterolateral artery and the PDA are patent. No obvious occlusions of the small distal branches.  ?Elevated LVEDP ?Hypertensive urgency (LV190/51/34  AO 170/112) ?  ?Recommendations: Will admit to the ICU. No clear coronary culprit for her presentation. She does have moderate  underlying CAD and is presenting with hypertensive urgency. She is mostly pain free at the conclusion of the cath. Will continue ASA and Brilinta for now. Will begin metoprolol and high intensity statin. We have started an IV NTG infusion in the cath lab for BP control. Will add Lisinopril today and start IV Lasix. Echo will be arranged. I suspect that with better BP control and diuresis she will feel better.  ?  ?2D Echo 04/29/21 ? ? 1. Left ventricular ejection fraction, by estimation, is 45 to 50%. The  ?left ventricle has mildly decreased function. The left ventricle  ?demonstrates global hypokinesis. There is moderate concentric left  ?ventricular hypertrophy. Left ventricular  ?diastolic parameters are consistent with Grade I diastolic dysfunction  ?(impaired relaxation).  ? 2. Right ventricular systolic function is normal. The right ventricular  ?size is normal.  ? 3. The mitral valve is normal in structure. Mild mitral valve  ?regurgitation. No evidence of mitral stenosis.  ? 4. The aortic valve is normal in structure. Aortic valve regurgitation is  ?not visualized. No aortic stenosis is present.  ? 5. The inferior vena cava is normal in size with greater than 50%  ?respiratory variability, suggesting right atrial pressure of 3 mmHg.  ? ?Comparison(s): No prior Echocardiogram.  ?_____________ ?  ?History of Present Illness   ?  ?Wendy Hodge is a 46 y.o. female with 46 y.o. female with history of tobacco use, untreated HTN, untreated hypothyroidism, remote PUD s/p cauterization of gastric ulcers, CKD stage  II-IIIa by labs presented with chest pain, hypertensive emergency, and EKG changes concerning for lateral ST elevation MI. She initially had had chest pain 2 nights prior that resolved after taking aspirin. On day of admission the chest pain recurred when she was going to bed at 1am with radiation into her shoulder and back, followed by diaphoresis. EMS was called. She was found to have borderline ST  elevation in lateral leads initially, more prominent on arrival to the ED. She was hypertensive with initial BP of 188/100 with EMS. She took 2 aspirin at home and received other 2 en route in addition to ntg x 4 and Zofran without improvement in her symptoms. She was taken to the cath lab due to concern for acute MI. ? ?Hospital Course  ?   ?1. CAD with acute STEMI (clarified with MD classified as STEMI) ?- cath showed findings above with predominantly moderate CAD as well as moderately severe ostial stenosis of intermediate branch too small for PCI, no clear coronary culprit for presentation, LVEDP elevated -> medical therapy recommended, no intervention felt indicated ?- troponins peaked at 17,309 ?- medical therapy recommended with ASA and Plavix for at least 6 months ?- started on high intensity rosuvastatin ?- started on carvedilol ?- cardiac rehab referred to [facility] ?  ?2. Ischemic cardiomyopathy/acute HFmEF ?- echo 04/29/21 with EF 45-50%, moderate LVH, G1DD, mild MR ?- elevated LVEDP in cath lab s/p IV Lasix, otherwise did not require active diuresis ?- losartan and carvedilol added ?- per Dr. Gasper Sells, would plan for outpatient Farxiga start as next medication (slight edema and slight increase in creatinine likely related to GDMT titration; it has been approved and authorized with $4 copay ?- HF recommendations relayed and provided HF education booklet ?  ?3. Essential HTN with hypertensive emergency ?- managed in context above ?  ?4. Untreated hypothyroidism ?- TSH is 120.126, free T4 also low -> previous TSH in 2022 was 130 so this is longstanding ?-  added initiation of levothyroxine 25 mcg daily with recommendation to f/u PCP to discuss recheck 4-6 weeks (asked care management to see at DC to help with getting PCP) ?  ?5. CKD stage II-IIIa by labs ?- Cr has ranged 1.14 - 1.32, largely similar to prior historical values back to 2021 ?- recommend BMET at upcoming [facility] visit ?- continue to follow as  OP ?  ?6. Hyperlipidemia ?- Tchol 253, HDL 62, trig 107, LDL 170 - may also be impacted by unteated thyroid disease, Lp(a) also ?203.7 ?- started on rosuvastatin ?- If the patient is tolerating statin at time of follow-up appointment, would consider rechecking liver function/lipid panel in 6-8 weeks. ?  ?7. Substance abuse ?- UDS + benzos, amphetamines, THC, also h/o tobacco ?- counseled this admission regarding cessation ?- many social stressors as outlined ?- Dr. Gasper Sells also discussed marijuana cessation and that CYP interactions can be associated with inappropriate metabolism of BB, 3.3x higher stroke/vascular disease with marijuana use ? ?8. Morbid obesity ?- lifestyle modification and thyroid treatment will be important ? ?Dr. Gasper Sells has seen and examined the patient today and feels she is stable for discharge. I have sent a message to our office's scheduling team requesting a 7-10 day TOC follow-up appointment as well as TOC call and our office will call the patient with this information. Patient requested meds be sent to [facility] in [place] (weekend discharge so [facility] not open). ? ? ?Did the patient have an acute coronary syndrome (MI, NSTEMI, STEMI, etc)  this admission?:  Yes                              ? ?AHA/ACC Clinical Performance & Quality Measures: ?Aspirin prescribed? - Yes ?ADP Receptor Inhibitor (Plavix/Clopidogrel, Brilinta/Ticagrelor or Effient/Prasugrel) prescribed (includes medically managed patients)? - Yes ?Beta Blocker prescribed? - Yes ?High Intensity Statin (Lipitor 40-80mg  or Crestor 20-40mg ) prescribed? - Yes ?EF assessed during THIS hospitalization? - Yes ?For EF <40%, was ACEI/ARB prescribed? - Not Applicable (EF >/= [id number]) ?For EF <40%, Aldosterone Antagonist (Spironolactone or Eplerenone) prescribed? - Not Applicable (EF >/= [id number]) ?Cardiac Rehab Phase II ordered (including medically managed patients)? - Yes  ? ?   ? ?The patient will be scheduled for a TOC follow  up appointment in 7-10 days.  A message has been sent to the [facility] and Scheduling Pool at the office where the patient should be seen for follow up.  ?_____________ ? ?Discharge Vitals ?Blood pressure (!) 144/75,

## 2021-05-02 NOTE — Progress Notes (Signed)
Cross Cover Note ?K 5.2. No mention of hemolysis but previous values wnl, not borderline up even. Will hold ARB and request repeat K now (fresh draw requested in care order). ?

## 2021-05-02 NOTE — Plan of Care (Signed)

## 2021-05-02 NOTE — Progress Notes (Signed)
? ?Progress Note ? ?Patient Name: Wendy Hodge ?Date of Encounter: 05/02/2021 ? ?Primary Cardiologist: Sinclair Grooms, MD ? ?Subjective  ? ?Feeling fair, no complaints. No CP or dyspnea. ?K was high in AM draw repeat WNL. ? ?Inpatient Medications  ?  ?Scheduled Meds: ? aspirin  81 mg Oral Daily  ? carvedilol  25 mg Oral BID WC  ? Chlorhexidine Gluconate Cloth  6 each Topical Daily  ? clopidogrel  75 mg Oral Daily  ? levothyroxine  25 mcg Oral Q0600  ? pantoprazole  40 mg Oral Daily  ? rosuvastatin  40 mg Oral Daily  ? sodium chloride flush  3 mL Intravenous Q12H  ? ?Continuous Infusions: ? sodium chloride Stopped (04/29/21 1739)  ? sodium chloride    ? ?PRN Meds: ?sodium chloride, acetaminophen, nitroGLYCERIN, ondansetron (ZOFRAN) IV, sodium chloride flush  ? ?Vital Signs  ?  ?Vitals:  ? 05/01/21 1240 05/01/21 1900 05/02/21 0500 05/02/21 0900  ?BP: 133/77 124/60 (!) 144/75   ?Pulse: 70 70 70   ?Resp: 14 20 14 15   ?Temp: 98.4 ?F (36.9 ?C) 98.1 ?F (36.7 ?C) 98 ?F (36.7 ?C)   ?TempSrc: Oral Oral Oral   ?SpO2: 100% 97% 95%   ?Weight:      ?Height:      ? ?No intake or output data in the 24 hours ending 05/02/21 1104 ? ?  04/30/2021  ?  2:33 PM 04/29/2021  ?  8:00 AM 04/29/2021  ?  4:09 AM  ?Last 3 Weights  ?Weight (lbs) 228 lb 250 lb 250 lb  ?Weight (kg) 103.42 kg 113.4 kg 113.399 kg  ?  ? ?Telemetry  ?  ?NSR - Personally Reviewed ? ?ECG  ?  ?No new tracings - Personally Reviewed ? ?Physical Exam  ? ?GEN: No acute distress.  ?HEENT: Normocephalic, atraumatic, sclera non-icteric. ?Neck: No JVD. ?Cardiac: RRR no murmurs, rubs, or gallops.  ?Respiratory: Clear to auscultation bilaterally. Breathing is unlabored. ?GI: Soft, nontender, non-distended, BS +x 4. ?MS: no deformity.  ?Extremities: No clubbing or cyanosis. Mild soft bilateral LE edema. Distal pedal pulses are 2+ and equal bilaterally.Right radial cath site without hematoma or ecchymosis; scattered macules on arms present on admission ?Neuro:  AAOx3. Follows  commands. ?Psych:  Responds to questions appropriately with a normal affect. ? ?Labs  ?  ?High Sensitivity Troponin:   ?Recent Labs  ?Lab 04/29/21 ?[id number] 04/29/21 ?1035 04/29/21 ?1250 04/30/21 ?1020 04/30/21 ?1225  ?TROPONINIHS 330* 597* 1,559* 17,309* 15,012*  ?   ? ?Cardiac EnzymesNo results for input(s): TROPONINI in the last 168 hours. No results for input(s): TROPIPOC in the last 168 hours.  ? ?Chemistry ?Recent Labs  ?Lab 04/29/21 ?0415 04/29/21 ?1035 04/30/21 ?0112 05/02/21 ?0310 05/02/21 ?0917  ?NA 138 136 133* 139  --   ?K 3.6 4.2 4.0 5.2* 3.9  ?CL 106 104 100 104  --   ?CO2 22 21* 23 23  --   ?GLUCOSE 117* 93 107* 96  --   ?BUN 14 14 17 20   --   ?CREATININE 1.26* 1.14* 1.28* 1.32*  --   ?CALCIUM 8.8* 9.0 9.2 9.2  --   ?PROT 7.1  --   --   --   --   ?ALBUMIN 3.8  --   --   --   --   ?AST 18  --   --   --   --   ?ALT 10  --   --   --   --   ?  ALKPHOS 74  --   --   --   --   ?BILITOT 0.7  --   --   --   --   ?GFRNONAA 54* >60 53* 51*  --   ?ANIONGAP 10 11 10 12   --   ?  ? ?Hematology ?Recent Labs  ?Lab 04/29/21 ?0415 04/30/21 ?0112 05/02/21 ?0601  ?WBC 12.8* 10.3 8.0  ?RBC 4.11 4.15 4.10  ?HGB 11.2* 11.0* 11.1*  ?HCT 35.7* 34.5* 35.4*  ?MCV 86.9 83.1 86.3  ?MCH 27.3 26.5 27.1  ?MCHC 31.4 31.9 31.4  ?RDW 16.4* 16.3* 16.4*  ?PLT 322 371 319  ? ? ?BNPNo results for input(s): BNP, PROBNP in the last 168 hours.  ? ?DDimer No results for input(s): DDIMER in the last 168 hours.  ? ?Radiology  ?  ?No results found. ? ?Cardiac Studies  ? ?Cath 04/29/21 ?  RPAV lesion is 50% stenosed. ?  Mid Cx lesion is 50% stenosed. ?  Prox RCA lesion is 50% stenosed. ?  Ramus lesion is 80% stenosed. ?  1st Diag lesion is 60% stenosed. ?  2nd Diag lesion is 100% stenosed. ?  ?Large caliber LAD that courses to the apex with no obstructive disease. The small caliber first diagonal branch has moderate disease but excellent flow. There appears to be calcification in the area of a second diagonal branch which could represent flush occlusion  of this vessel but no dye staining noted.  ?The Circumflex gives off a small intermediate branch, a large obtuse marginal branch and then continues as a moderate caliber vessel in the AV groove. The intermediate branch has a moderately severe ostial stenosis but this vessel is too small for PCI. The obtuse marginal is patent and has no obstructive disease. The mid Circumflex has a moderate stenosis.  ?The RCA is a large dominant vessel with moderate mid stenosis. The posterolateral artery and the PDA are patent. No obvious occlusions of the small distal branches.  ?Elevated LVEDP ?Hypertensive urgency (LV190/51/34  AO 170/112) ?  ?Recommendations: Will admit to the ICU. No clear coronary culprit for her presentation. She does have moderate underlying CAD and is presenting with hypertensive urgency. She is mostly pain free at the conclusion of the cath. Will continue ASA and Brilinta for now. Will begin metoprolol and high intensity statin. We have started an IV NTG infusion in the cath lab for BP control. Will add Lisinopril today and start IV Lasix. Echo will be arranged. I suspect that with better BP control and diuresis she will feel better.  ? ?2D Echo 04/29/21 ? ? 1. Left ventricular ejection fraction, by estimation, is 45 to 50%. The  ?left ventricle has mildly decreased function. The left ventricle  ?demonstrates global hypokinesis. There is moderate concentric left  ?ventricular hypertrophy. Left ventricular  ?diastolic parameters are consistent with Grade I diastolic dysfunction  ?(impaired relaxation).  ? 2. Right ventricular systolic function is normal. The right ventricular  ?size is normal.  ? 3. The mitral valve is normal in structure. Mild mitral valve  ?regurgitation. No evidence of mitral stenosis.  ? 4. The aortic valve is normal in structure. Aortic valve regurgitation is  ?not visualized. No aortic stenosis is present.  ? 5. The inferior vena cava is normal in size with greater than 50%   ?respiratory variability, suggesting right atrial pressure of 3 mmHg.  ? ?Comparison(s): No prior Echocardiogram.  ?  ? ?Patient Profile  ?   ?47 y.o. female with history of tobacco  use, untreated HTN, untreated hypothyroidism, remote PUD s/p cauterization of gastric ulcers, CKD stage II-IIIa by labs presented with chest pain, hypertensive emergency, and EKG changes concerning for lateral ST elevation MI. LHC showed no clear culprit with only small vessel disease and moderate disease of Lcx and RCA identified. She was also noted to have elevated LVEDP. TTE showed EF 45 to 50%, global LV hypokinesis, Grade I diastolic dysfunction, and moderate concentric LVH.  ? ?Assessment & Plan  ?  ?1. CAD with acute MI  ?- cath without clear features of culprit lesion so unclear if this is considered NSTEMI or STEMI (had significant lateral ST elevation on arrival, improved on follow-up EKGs but Q waves present in V2, V2, avL) ?- medical therapy recommended with ASA and Plavix for at least 6 months ?- started on rosuvastatin 40mg  daily ?- carvedilol titrated to 25mg  BID today ?- cardiac rehab following ? ?2. Ischemic cardiomyopathy/acute HFmEF ?- echo 04/29/21 with EF 45-50%, moderate LVH, G1DD, mild MR ?- elevated LVEDP in cath lab s/p IV Lasix, otherwise not actively being diuresed ?- losartan titrated to 100mg  daily and carvedilol titrated to 25mg  BID today ?- will plan for outpatient Farxiga start as next medication(slight edema and slight increase in creatinine likely related to GDMT titration; it has been approved and authorized) ? ?3. Essential HTN with hypertensive emergency ?- manage in context above ? ?4. Untreated hypothyroidism ?- TSH is 120, will add free T4 -> previous TSH in 2022 was 130 so this is longstanding ?-  adding initiation of 25 mcg daily with recheck 4-6 weeks (needs PCP) ? ?5. CKD stage II-IIIa, transient hyperkalemia improved with repeat lab ?- slight increase related to GDMT; will not back off  medication ? ?6. Hyperlipidemia ?- Tchol 253, HDL 62, trig 107, LDL 170 - may also be impacted by unteated thyroid disease, Lp(a) also ?203.7 ?- started on rosuvastatin ?- If the patient is tolerating statin at time of follo

## 2021-05-04 ENCOUNTER — Telehealth: Payer: Self-pay

## 2021-05-04 NOTE — Telephone Encounter (Signed)
**Note De-identified Heru Montz Obfuscation** -----  **Note De-Identified Raheel Kunkle Obfuscation** Message from Charlie Pitter, [place] sent at 05/02/2021 11:28 AM EDT ----- ?Regarding: Needs TOC visit ?Hi scheduling team! This is Dayna, one of the PAs with our HeartCare team. Our team saw this patient in the hospital and the MD recommended arranging a TOC follow-up visit.  ? ?Please schedule this patient for a post-hospital follow-up appointment and call them with that information: ? ?Primary Cardiologist: Tamala Julian ?Appointment Needed Within: 7-10 days ?Appointment Type: Transition of care - will need TOC phone call too post-STEMI. Lives in [place] to schedule with APP in patient's preferred location, had not previously seen our team prior to this admission. ? ?Thank you for all you do! ? ?Dayna Dunn PA-C ? ? ?

## 2021-05-04 NOTE — Telephone Encounter (Deleted)
**Note De-identified Kinnie Kaupp Obfuscation** -----  **Note De-Identified Bellarose Burtt Obfuscation** Message from [facility] sent at 05/04/2021 10:31 AM EDT ----- ?Regarding: RE: Needs TOC visit ?I called her yesterday, her VM is not set up ?----- Message ----- ?From: ViaLorelle Formosa, LPN ?Sent: 05/04/2021  10:25 AM EDT ?To: [facility] Scheduling ?Subject: FW: Needs TOC visit                           ? ?Hi All, ?I plan on calling the pt today at around 12:00. ?If her hosp f/u has not been scheduled, I will advise her that you guys will be giving her a call to schedule. ?Thanks, ?Larita Fife ?----- Message ----- ?From: Laurann Montana, PA-C ?Sent: 05/02/2021  11:52 AM EDT ?To: [facility] [employer], [facility] Toc ?Subject: Needs TOC visit                               ? ?Hi scheduling team! This is Dayna, one of the PAs with our HeartCare team. Our team saw this patient in the hospital and the MD recommended arranging a TOC follow-up visit.  ? ?Please schedule this patient for a post-hospital follow-up appointment and call them with that information: ? ?Primary Cardiologist: Katrinka Blazing ?Appointment Needed Within: 7-10 days ?Appointment Type: Transition of care - will need TOC phone call too post-STEMI. Lives in [place] to schedule with APP in patient's preferred location, had not previously seen our team prior to this admission. ? ?Thank you for all you do! ? ?Dayna Dunn PA-C ? ? ? ? ?

## 2021-05-04 NOTE — Telephone Encounter (Signed)
**Note De-Identified [name] Obfuscation** Patient contacted regarding discharge from [facility] on 05/02/2021. ? ?Patient advised that our schedulers have been unable to reach her and could not leave her a message as her VM has not been set up. ?She states that she will call our scheduling team today to schedule her hospital f/u and she verified that she does have [facility]'s phone number. ? ?Patient understands discharge instructions? Yes ?Patient understands medications and regiment? Yes ?Patient understands to bring all medications to this visit? Yes ? ?Ask patient:  Are you enrolled in My Chart: No but states that she has all the information she needs if she decides to enroll. ? ?The pt states that she has been tired and fatigued since she came home from the hospital but understands that this will improve with time. ?She denies having any CP/discomfort, SOB, lightheadedness, dizziness, nausea, diaphoresis, or headaches. ? ?She thanked me for my call. ? ?  ?

## 2021-05-05 ENCOUNTER — Telehealth (HOSPITAL_COMMUNITY): Payer: Self-pay

## 2021-05-05 NOTE — Telephone Encounter (Signed)
Per phase I cardiac rehab, fax cardiac rehab referral to [facility]. °

## 2021-05-19 NOTE — Progress Notes (Deleted)
Cardiology Office Note:    Date:  05/19/2021   ID:  Wendy Hodge, DOB Dec 29, 1975, MRN [id number]  PCP:  Pcp, No   [facility] Providers Cardiologist:  Sinclair Grooms, MD { Click to update primary MD,subspecialty MD or APP then REFRESH:1}    Referring MD: No ref. provider found   Chief Complaint: ***  History of Present Illness:    Wendy Hodge is a 46 y.o. female with a hx of CAD s/p STEMI with no clear culprit lesion for PCI, hypertension, CKD Stage 3a, hyperlipidemia, ischemic cardiomyopathy, tobacco abuse, substance abuse, and untreated hypothyroidism  She presented with chest pain, hypertensive emergency, and EKG changes concerning for lateral STEMI. On day of admission she reported chest pain that started 2 days prior to admission with reoccurrence on day of admission accompanied by radiation into shoulder and back followed by diaphoresis. ST elevation in lateral leads became more prominent on arrival to ED with BP of 188/100. Troponin peaked at 17,309. Pain relieved by nitroglycerin x 4 and she was taken to cardiac cath lab. LHC revealed large caliber LAD with no obstructive disease, moderate underlying CAD as defined below, no clear coronary culprit for her presentation, elevated LVEDP, hypertensive urgency. IV Lasix in cath lab, otherwise did not require active diuresis. Plan for medical therapy with aspirin and Plavix for at least 6 months. Echo revealed LVEF 45-50%, moderate LVH, G1 DD, mild MR. TSH 120, free T4 low as well, levothyroxine initiated with arrangements for PCP initiated.   Today, she is here   [place] approved for $4  BMET Recheck lipids/liver  Past Medical History:  Diagnosis Date   CAD (coronary artery disease)    Chronic kidney disease, stage 3a ([place])    Elevated lipoprotein(a)    Essential hypertension    Heart failure with mid-range ejection fraction (HFmEF) (HCC)    Hyperlipidemia    Hypothyroidism    Morbid obesity ([place])    STEMI (ST elevation  myocardial infarction) ([place])    Substance abuse ([place])     *** The histories are not reviewed yet. Please review them in the "History" navigator section and refresh this [place].  Current Medications: No outpatient medications have been marked as taking for the 05/21/21 encounter (Appointment) with Ann Maki, Lanice Schwab, NP.     Allergies:   Penicillins, Sulfa antibiotics, Latex, and Penicillin g   Social History   Socioeconomic History   Marital status: Widowed    Spouse name: Not on file   Number of children: Not on file   Years of education: Not on file   Highest education level: Not on file  Occupational History   Not on file  Tobacco Use   Smoking status: Every Day    Packs/day: 1.00    Types: Cigarettes    Start date: 01/31/1990   Smokeless tobacco: Never  Substance and Sexual Activity   Alcohol use: Not on file   Drug use: Not on file   Sexual activity: Not on file  Other Topics Concern   Not on file  Social History Narrative   Not on file   Social Determinants of Health   Financial Resource Strain: Not on file  Food Insecurity: Not on file  Transportation Needs: Not on file  Physical Activity: Not on file  Stress: Not on file  Social Connections: Not on file     Family History: The patient's ***family history is not on file.  ROS:   Please see the history of present illness.    ***  All other systems reviewed and are negative.  Labs/Other Studies Reviewed:    The following studies were reviewed today:  LHC 04/29/21    RPAV lesion is 50% stenosed.   Mid Cx lesion is 50% stenosed.   Prox RCA lesion is 50% stenosed.   Ramus lesion is 80% stenosed.   1st Diag lesion is 60% stenosed.   2nd Diag lesion is 100% stenosed.   Large caliber LAD that courses to the apex with no obstructive disease. The small caliber first diagonal branch has moderate disease but excellent flow. There appears to be calcification in the area of a second diagonal branch which  could represent flush occlusion of this vessel but no dye staining noted.  The Circumflex gives off a small intermediate branch, a large obtuse marginal branch and then continues as a moderate caliber vessel in the AV groove. The intermediate branch has a moderately severe ostial stenosis but this vessel is too small for PCI. The obtuse marginal is patent and has no obstructive disease. The mid Circumflex has a moderate stenosis.  The RCA is a large dominant vessel with moderate mid stenosis. The posterolateral artery and the PDA are patent. No obvious occlusions of the small distal branches.  Elevated LVEDP Hypertensive urgency (LV190/51/34  AO 170/112)   Recommendations: Will admit to the ICU. No clear coronary culprit for her presentation. She does have moderate underlying CAD and is presenting with hypertensive urgency. She is mostly pain free at the conclusion of the cath. Will continue ASA and Brilinta for now. Will begin metoprolol and high intensity statin. We have started an IV NTG infusion in the cath lab for BP control. Will add Lisinopril today and start IV Lasix. Echo will be arranged. I suspect that with better BP control and diuresis she will feel better.   \  Echo 04/29/21  Left Ventricle: Left ventricular ejection fraction, by estimation, is 45  to 50%. The left ventricle has mildly decreased function. The left  ventricle demonstrates global hypokinesis. Global longitudinal strain  performed but not reported based on  interpreter judgement due to suboptimal tracking. The left ventricular  internal cavity size was normal in size. There is moderate concentric left  ventricular hypertrophy. Left ventricular diastolic parameters are  consistent with Grade I diastolic dysfunction (impaired relaxation).  Right Ventricle: The right ventricular size is normal. No increase in  right ventricular wall thickness. Right ventricular systolic function is  normal.  Left Atrium: Left atrial size  was normal in size.  Right Atrium: Right atrial size was normal in size.  Pericardium: There is no evidence of pericardial effusion.  Mitral Valve: The mitral valve is normal in structure. There is mild  thickening of the mitral valve leaflet(s). Mild mitral valve  regurgitation. No evidence of mitral valve stenosis. MV peak gradient, 7.6  mmHg. The mean mitral valve gradient is 3.0 mmHg.  Tricuspid Valve: The tricuspid valve is normal in structure. Tricuspid  valve regurgitation is not demonstrated. No evidence of tricuspid  stenosis.  Aortic Valve: The aortic valve is normal in structure. Aortic valve  regurgitation is not visualized. No aortic stenosis is present. Aortic  valve mean gradient measures 3.0 mmHg. Aortic valve peak gradient measures  5.1 mmHg. Aortic valve area, by VTI  measures 2.68 cm.  Pulmonic Valve: The pulmonic valve was normal in structure. Pulmonic valve  regurgitation is not visualized. No evidence of pulmonic stenosis.  Aorta: The aortic root is normal in size and structure.  Venous: The  inferior vena cava is normal in size with greater than 50%  respiratory variability, suggesting right atrial pressure of 3 mmHg.  IAS/Shunts: No atrial level shunt detected by color flow Doppler.    Recent Labs: 04/29/2021: ALT 10; Magnesium 2.2; TSH 120.126 05/02/2021: BUN 20; Creatinine, Ser 1.32; Hemoglobin 11.1; Platelets 319; Potassium 3.9; Sodium 139  Recent Lipid Panel    Component Value Date/Time   CHOL 253 (H) 04/29/2021 0415   TRIG 107 04/29/2021 0415   HDL 62 04/29/2021 0415   CHOLHDL 4.1 04/29/2021 0415   VLDL 21 04/29/2021 0415   LDLCALC 170 (H) 04/29/2021 0415     Risk Assessment/Calculations:   {Does this patient have ATRIAL FIBRILLATION?:[id number]}       Physical Exam:    VS:  There were no vitals taken for this visit.    Wt Readings from Last 3 Encounters:  04/30/21 228 lb (103.4 kg)     GEN: *** Well nourished, well developed in no acute  distress HEENT: Normal NECK: No JVD; No carotid bruits CARDIAC: ***RRR, no murmurs, rubs, gallops RESPIRATORY:  Clear to auscultation without rales, wheezing or rhonchi  ABDOMEN: Soft, non-tender, non-distended MUSCULOSKELETAL:  No edema; No deformity. *** pedal pulses, ***bilaterally SKIN: Warm and dry NEUROLOGIC:  Alert and oriented x 3 PSYCHIATRIC:  Normal affect   EKG:  EKG is *** ordered today.  The ekg ordered today demonstrates ***  Diagnoses:    No diagnosis found. Assessment and Plan:     ***          {Are you ordering a CV Procedure (e.g. stress test, cath, DCCV, TEE, etc)?   Press F2        :[id number]    Medication Adjustments/Labs and Tests Ordered: Current medicines are reviewed at length with the patient today.  Concerns regarding medicines are outlined above.  No orders of the defined types were placed in this encounter.  No orders of the defined types were placed in this encounter.   There are no Patient Instructions on file for this visit.   Signed, Emmaline Life, NP  05/19/2021 8:19 AM    [place]

## 2021-05-21 ENCOUNTER — Ambulatory Visit: Payer: Medicaid Other | Admitting: Nurse Practitioner

## 2021-09-25 ENCOUNTER — Other Ambulatory Visit: Payer: Self-pay | Admitting: Physician Assistant

## 2022-01-07 ENCOUNTER — Other Ambulatory Visit: Payer: Self-pay | Admitting: Physician Assistant

## 2022-01-12 NOTE — Progress Notes (Unsigned)
Cardiology Office Note:    Date:  01/13/2022   ID:  Wendy Hodge, DOB [birth year], MRN [id number]  PCP:  Pcp, No  Cardiologist:  Wendy NoeHenry W Abri Vacca III, MD   Referring MD: No ref. 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 female with a hx of tobacco use, untreated HTN, untreated hypothyroidism, remote PUD s/p cauterization of gastric ulcers, CKD stage II-IIIa, primary hypertension, lateral ST elevation MI with no clear culprit (moderate disease of Lcx, RCA, and diagonal), acute on chronic combined systolic and diastolic with EF 45 to 50%, and hypertensive heart disease.    She continues to smoke cigarettes.  If she walks too far or too fast she gets extremely short of breath and experiences some chest discomfort.  She is use nitroglycerin intermittently with some improvement.  She denies orthopnea.  She snores.  Past Medical History:  Diagnosis Date   CAD (coronary artery disease)    Chronic kidney disease, stage 3a (HCC)    Elevated lipoprotein(a)    Essential hypertension    Heart failure with mid-range ejection fraction (HFmEF) (HCC)    Hyperlipidemia    Hypothyroidism    Morbid obesity (HCC)    STEMI (ST elevation myocardial infarction) (HCC)    Substance abuse (HCC)     Past Surgical History:  Procedure Laterality Date   LEFT HEART CATH AND CORONARY ANGIOGRAPHY N/A 04/29/2021   Procedure: LEFT HEART CATH AND CORONARY ANGIOGRAPHY;  Surgeon: Kathleene HazelMcAlhany, Christopher D, MD;  Location: [facility];  Service: Cardiovascular;  Laterality: N/A;    Current Medications: Current Meds  Medication Sig   aspirin EC 81 MG tablet Take 1 tablet (81 mg total) by mouth daily. Swallow whole.   clopidogrel (PLAVIX) 75 MG tablet Take 1 tablet (75 mg total) by mouth daily.    levothyroxine (SYNTHROID) 25 MCG tablet Take 1 tablet (25 mcg total) by mouth daily before breakfast.   nitroGLYCERIN (NITROSTAT) 0.4 MG SL tablet Place 1 tablet (0.4 mg total) under the tongue every 5 (five) minutes as needed for chest pain (up to 3 doses).   rosuvastatin (CRESTOR) 40 MG tablet Take 1 tablet (40 mg total) by mouth daily.   sacubitril-valsartan (ENTRESTO) 24-26 MG Take 1 tablet by mouth 2 (two) times daily.   spironolactone (ALDACTONE) 25 MG tablet Take 1 tablet (25 mg total) by mouth daily.   [DISCONTINUED] carvedilol (COREG) 25 MG tablet Take 1 tablet (25 mg total) by mouth 2 (two) times daily with a meal.   [DISCONTINUED] losartan (COZAAR) 100 MG tablet Take 1 tablet (100 mg total) by mouth daily.   [DISCONTINUED] pantoprazole (PROTONIX) 40 MG tablet Take 1 tablet (40 mg total) by mouth daily.     Allergies:   Penicillins, Sulfa antibiotics, Latex, and Penicillin g   Social History   Socioeconomic History   Marital status: Widowed    Spouse name: Not on file   Number of children: Not on file   Years of education: Not on file   Highest education level: Not on file  Occupational History   Not on file  Tobacco Use   Smoking status: Every Day    Packs/day: 1.00    Types: Cigarettes    Start date: 01/31/1990   Smokeless tobacco: Never  Substance and  Sexual Activity   Alcohol use: Not on file   Drug use: Not on file   Sexual activity: Not on file  Other Topics Concern   Not on file  Social History Narrative   Not on file   Social Determinants of Health   Financial Resource Strain: Not on file  Food Insecurity: Not on file  Transportation Needs: Not on file  Physical Activity: Not on file  Stress: Not on file  Social Connections: Not on file     Family History: The patient's family history is not on file.  ROS:   Please see the history of present illness.    Anxiety.  She has stopped using crystal meth.  Still smoking cigarettes.  Had a syncopal episode  but poorly characterized relative to history.  All other systems reviewed and are negative.  EKGs/Labs/Other Studies Reviewed:    The following studies were reviewed today:  2D Doppler echocardiogram 04/29/2021: IMPRESSIONS   1. Left ventricular ejection fraction, by estimation, is 45 to 50%. The  left ventricle has mildly decreased function. The left ventricle  demonstrates global hypokinesis. There is moderate concentric left  ventricular hypertrophy. Left ventricular  diastolic parameters are consistent with Grade I diastolic dysfunction  (impaired relaxation).   2. Right ventricular systolic function is normal. The right ventricular  size is normal.   3. The mitral valve is normal in structure. Mild mitral valve  regurgitation. No evidence of mitral stenosis.   4. The aortic valve is normal in structure. Aortic valve regurgitation is  not visualized. No aortic stenosis is present.   5. The inferior vena cava is normal in size with greater than 50%  respiratory variability, suggesting right atrial pressure of 3 mmHg.   Comparison(s): No prior Echocardiogram.    Catheterization 04/29/2021: iagnostic Dominance: Right   Lower extremity Doppler study 04/29/2021: Summary:  Right: Resting right ankle-brachial index indicates noncompressible right  lower extremity arteries. The right toe-brachial index is normal.   Left: Resting left ankle-brachial index indicates noncompressible left  lower extremity arteries. The left toe-brachial index is normal.     EKG:  EKG new tracing is not repeated at  Recent Labs: 04/29/2021: ALT 10; Magnesium 2.2; TSH 120.126 05/02/2021: BUN 20; Creatinine, Ser 1.32; Hemoglobin 11.1; Platelets 319; Potassium 3.9; Sodium 139  Recent Lipid Panel    Component Value Date/Time   CHOL 253 (H) 04/29/2021 0415   TRIG 107 04/29/2021 0415   HDL 62 04/29/2021 0415   CHOLHDL 4.1 04/29/2021 0415   VLDL 21 04/29/2021 0415   LDLCALC 170 (H) 04/29/2021 0415     Physical Exam:    VS:  BP (!) 158/90   Pulse 98   Ht 5\' 5"  (1.651 m)   Wt 245 lb 9.6 oz (111.4 kg)   SpO2 97%   BMI 40.87 kg/m     Wt Readings from Last 3 Encounters:  01/13/22 245 lb 9.6 oz (111.4 kg)  04/30/21 228 lb (103.4 kg)     GEN: Obese. No acute distress HEENT: Normal NECK: No JVD. LYMPHATICS: No lymphadenopathy CARDIAC: No murmur. RRR no gallop, or edema. VASCULAR:  Normal Pulses. No bruits. RESPIRATORY:  Clear to auscultation without rales, wheezing or rhonchi  ABDOMEN: Soft, non-tender, non-distended, No pulsatile mass, MUSCULOSKELETAL: No deformity  SKIN: Warm and dry NEUROLOGIC:  Alert and oriented x 3 PSYCHIATRIC:  Normal affect   ASSESSMENT:    1. Coronary artery disease involving native coronary artery of native heart without angina pectoris  2. Hyperlipidemia LDL goal <70   3. Heart failure with mid-range ejection fraction (HFmEF) (HCC)   4. Essential hypertension   5. Stage 3a chronic kidney disease (CKD) (HCC)    PLAN:    In order of problems listed above:  She has moderate small vessel coronary disease.  Advance antihypertensive regimen and if still having angina with minimal activity with blood pressure control, may need further investigation with coronary angiography.  Encouraged her to stop smoking.  Use nitroglycerin as needed for chest pain.   Continue rosuvastatin.  Lipid panel fasting in 1 week. Discontinue losartan and start Entresto 24/26 mg p.o. twice daily.  Start spironolactone 25 mg/day.  Basic metabolic panel in 1 week.  Office visit with APP in 1 week.  Depending upon the blood pressure, consider adding a loop diuretic or SGLT2. Follow-up blood pressure and 1 week on current above recommended changes.  At next office visit assuming today's labs are okay with the adjustments made above, Entresto dose may need to be uptitrated and potentially a loop diuretic started. Basic metabolic panel today.  Guideline directed therapy for  left ventricular systolic dysfunction: Angiotensin receptor-neprilysin inhibitor (ARNI)-Entresto; beta-blocker therapy - carvedilol, metoprolol succinate, or bisoprolol; mineralocorticoid receptor antagonist (MRA) therapy -spironolactone or eplerenone.  SGLT-2 agents -  Dapagliflozin Marcelline Deist) or Empagliflozin (Jardiance).These therapies have been shown to improve clinical outcomes including reduction of rehospitalization, survival, and acute heart failure.  Overall education and awareness concerning primary/secondary risk prevention was discussed in detail: LDL less than 70, hemoglobin A1c less than 7, blood pressure target less than 130/80 mmHg, >150 minutes of moderate aerobic activity per week, avoidance of smoking, weight control (via diet and exercise), and continued surveillance/management of/for obstructive sleep apnea.    Medication Adjustments/Labs and Tests Ordered: Current medicines are reviewed at length with the patient today.  Concerns regarding medicines are outlined above.  Orders Placed This Encounter  Procedures   Lipid panel   Comprehensive metabolic panel   Basic metabolic panel   Meds ordered this encounter  Medications   pantoprazole (PROTONIX) 40 MG tablet    Sig: Take 1 tablet (40 mg total) by mouth daily.    Dispense:  90 tablet    Refill:  3   carvedilol (COREG) 25 MG tablet    Sig: Take 1 tablet (25 mg total) by mouth 2 (two) times daily with a meal.    Dispense:  180 tablet    Refill:  3   sacubitril-valsartan (ENTRESTO) 24-26 MG    Sig: Take 1 tablet by mouth 2 (two) times daily.    Dispense:  60 tablet    Refill:  11    Please Honor Card patient is presenting for Wendy Hodge: [id number]; Alvira Philips: [id number]; RXPCN: [id number]; RXID: [id number]   spironolactone (ALDACTONE) 25 MG tablet    Sig: Take 1 tablet (25 mg total) by mouth daily.    Dispense:  90 tablet    Refill:  3    Patient Instructions  Medication Instructions:  Your physician has recommended you  make the following change in your medication:  1) STOP Losartan 2) START (tomorrow morning 01/14/22) Sherryll Burger 24-26mg  twice daily 3) START spironolactone 25mg  daily  *If you need a refill on your cardiac medications before your next appointment, please call your pharmacy*  Lab Work: TODAY: BMET  In 1 week: fasting CMET, Lipid panel (same day as office visit) If you have labs (blood work) drawn today and your tests are completely normal, you will receive your  results only by: MyChart Message (if you have MyChart) OR A paper copy in the mail If you have any lab test that is abnormal or we need to change your treatment, we will call you to review the results.  Follow-Up: At [facility], you and your health needs are our priority.  As part of our continuing mission to provide you with exceptional heart care, we have created designated Provider Care Teams.  These Care Teams include your primary Cardiologist (physician) and Advanced Practice Providers (APPs -  Physician Assistants and Nurse Practitioners) who all work together to provide you with the care you need, when you need it.  Your next appointment:   1 week(s)  The format for your next appointment:   In Person  Provider:   Ronie Spies, PA-C, Robin Searing, NP, Eligha Bridegroom, NP, or Tereso Newcomer, PA-C   Important Information About Sugar          Signed, Wendy Noe, MD  01/13/2022 10:22 AM    [facility]

## 2022-01-13 ENCOUNTER — Ambulatory Visit: Payer: Medicaid Other | Attending: Interventional Cardiology | Admitting: Interventional Cardiology

## 2022-01-13 ENCOUNTER — Encounter: Payer: Self-pay | Admitting: Interventional Cardiology

## 2022-01-13 VITALS — BP 158/90 | HR 98 | Ht 65.0 in | Wt 245.6 lb

## 2022-01-13 DIAGNOSIS — I5022 Chronic systolic (congestive) heart failure: Secondary | ICD-10-CM | POA: Diagnosis not present

## 2022-01-13 DIAGNOSIS — I10 Essential (primary) hypertension: Secondary | ICD-10-CM

## 2022-01-13 DIAGNOSIS — I2510 Atherosclerotic heart disease of native coronary artery without angina pectoris: Secondary | ICD-10-CM | POA: Diagnosis not present

## 2022-01-13 DIAGNOSIS — N1831 Chronic kidney disease, stage 3a: Secondary | ICD-10-CM

## 2022-01-13 DIAGNOSIS — E785 Hyperlipidemia, unspecified: Secondary | ICD-10-CM

## 2022-01-13 LAB — BASIC METABOLIC PANEL
BUN/Creatinine Ratio: 14 (ref 9–23)
BUN: 16 mg/dL (ref 6–24)
CO2: 22 mmol/L (ref 20–29)
Calcium: 9 mg/dL (ref 8.7–10.2)
Chloride: 102 mmol/L (ref 96–106)
Creatinine, Ser: 1.13 mg/dL — ABNORMAL HIGH (ref 0.57–1.00)
Glucose: 90 mg/dL (ref 70–99)
Potassium: 4.4 mmol/L (ref 3.5–5.2)
Sodium: 138 mmol/L (ref 134–144)
eGFR: 61 mL/min/{1.73_m2} (ref >59)

## 2022-01-13 MED ORDER — PANTOPRAZOLE SODIUM 40 MG PO TBEC
40.0000 mg | DELAYED_RELEASE_TABLET | Freq: Every day | ORAL | 3 refills | Status: AC
Start: 2022-01-13 — End: ?

## 2022-01-13 MED ORDER — SPIRONOLACTONE 25 MG PO TABS: 25.0000 mg | ORAL_TABLET | Freq: Every day | ORAL | 3 refills | Status: AC

## 2022-01-13 MED ORDER — CARVEDILOL 25 MG PO TABS: 25.0000 mg | ORAL_TABLET | Freq: Two times a day (BID) | ORAL | 3 refills | Status: DC

## 2022-01-13 MED ORDER — SACUBITRIL-VALSARTAN 24-26 MG PO TABS: 1.0000 | ORAL_TABLET | Freq: Two times a day (BID) | ORAL | 11 refills | Status: DC

## 2022-01-13 NOTE — Patient Instructions (Addendum)
Medication Instructions:  Your physician has recommended you make the following change in your medication:  1) STOP Losartan 2) START (tomorrow morning 01/14/22) Sherryll Burger 24-26mg  twice daily 3) START spironolactone 25mg  daily  *If you need a refill on your cardiac medications before your next appointment, please call your pharmacy*  Lab Work: TODAY: BMET  In 1 week: fasting CMET, Lipid panel (same day as office visit) If you have labs (blood work) drawn today and your tests are completely normal, you will receive your results only by: MyChart Message (if you have MyChart) OR A paper copy in the mail If you have any lab test that is abnormal or we need to change your treatment, we will call you to review the results.  Follow-Up: At [facility], you and your health needs are our priority.  As part of our continuing mission to provide you with exceptional heart care, we have created designated Provider Care Teams.  These Care Teams include your primary Cardiologist (physician) and Advanced Practice Providers (APPs -  Physician Assistants and Nurse Practitioners) who all work together to provide you with the care you need, when you need it.  Your next appointment:   1 week(s)  The format for your next appointment:   In Person  Provider:   [facility], PA-C, Ronie Spies, NP, Robin Searing, NP, or Eligha Bridegroom, PA-C   Important Information About Sugar

## 2022-01-19 NOTE — Progress Notes (Unsigned)
Office Visit    Patient Name: Wendy Hodge Date of Encounter: 01/20/2022  Primary Care Provider:  Pcp, No Primary Cardiologist:  Lesleigh Noe, MD Primary Electrophysiologist: None  Chief Complaint    Wendy Hodge is a 46 y.o. female with PMH of CAD s/p lateral STEMI with moderate disease of left circumflex, RCA and diagonal chronic combined CHF, HTN, HLD, CHF, tobacco abuse, obesity, CKD stage IIIa who presents today for shortness of breath and chest discomfort.  Past Medical History    Past Medical History:  Diagnosis Date   CAD (coronary artery disease)    Chronic kidney disease, stage 3a (HCC)    Elevated lipoprotein(a)    Essential hypertension    Heart failure with mid-range ejection fraction (HFmEF) (HCC)    Hyperlipidemia    Hypothyroidism    Morbid obesity (HCC)    STEMI (ST elevation myocardial infarction) (HCC)    Substance abuse (HCC)    Past Surgical History:  Procedure Laterality Date   LEFT HEART CATH AND CORONARY ANGIOGRAPHY N/A 04/29/2021   Procedure: LEFT HEART CATH AND CORONARY ANGIOGRAPHY;  Surgeon: Kathleene Hazel, MD;  [facility]: [facility];  Service: Cardiovascular;  Laterality: N/A;    Allergies  Allergies  Allergen Reactions   Penicillins Hives and Rash   Sulfa Antibiotics Rash and Hives    Other reaction(s): Other (see comments) Makes her crazy    Latex Rash   Penicillin G Rash    History of Present Illness    Wendy Hodge  is a 46 year old female with the above mention past medical history who presents today for 1 week follow-up of hypertension and CHF.  Wendy Hodge was initially seen on 03/2021 for chest pain and found to have lateral STEMI.  Blood pressures were hypertensive at 180/100 and nitroglycerin x 4 and 2 aspirin were given by EMS with improvement in symptoms.  She underwent emergent cath that showed moderate disease in mid RCA and mid circumflex with severe ostial stenosis of small ramus and occluded D2.  2D  echo was completed showing EF of 45-50% with global LV hypokinesis, grade 1 DD and moderate concentric LVH.  GDMT was started and Lasix drip was initiated for diuresis.  Medical therapy was also initiated with high intensity statin and ASA.  She was seen by Dr. Katrinka Blazing 01/13/2022 with poorly controlled blood pressure and shortness of breath with exertion.  She also reports chest pain with increased ambulation.  During visit further GDMT was initiated with Entresto and spironolactone.  Losartan was discontinued and patient will have further titration at follow-up.  Wendy Hodge presents today for 1 week follow-up with her family member.  Since last being seen in the office patient reports that she has still been experiencing chest discomfort and shortness of breath with activity.  She describes pain as exertional that travels to the shoulder and armpit.  During our visit her blood pressure was extremely elevated with initial reading at 162/102 and second reading at 172/102.  We administered clonidine 0.1 mg x 2 and blood pressures reduced to 158/98.  She was euvolemic on examination and heart rate was 75 bpm.  She reports full compliance with her medications and denied any adverse reactions.  She reports that she does have to use nitroglycerin daily as well as 81 mg aspirin.  She reports nosebleeds with clopidogrel that bleed for a long period.  We further discussed titration and increasing medications to optimize GDMT for heart failure.  Patient denies  palpitations, PND, orthopnea, nausea, vomiting, dizziness, syncope, edema, weight gain, or early satiety.  Home Medications    Current Outpatient Medications  Medication Sig Dispense Refill   aspirin EC 81 MG tablet Take 1 tablet (81 mg total) by mouth daily. Swallow whole. 30 tablet 5   clopidogrel (PLAVIX) 75 MG tablet Take 1 tablet (75 mg total) by mouth daily. 30 tablet 5   furosemide (LASIX) 20 MG tablet Take 1 tablet (20 mg total) by mouth daily as needed  (shortness of breath). 30 tablet 0   isosorbide mononitrate (IMDUR) 30 MG 24 hr tablet Take 1 tablet (30 mg total) by mouth daily. Can take an additional tablet for chest pain 45 tablet 2   levothyroxine (SYNTHROID) 25 MCG tablet Take 1 tablet (25 mcg total) by mouth daily before breakfast. 30 tablet 1   nitroGLYCERIN (NITROSTAT) 0.4 MG SL tablet Place 1 tablet (0.4 mg total) under the tongue every 5 (five) minutes as needed for chest pain (up to 3 doses). 25 tablet 3   pantoprazole (PROTONIX) 40 MG tablet Take 1 tablet (40 mg total) by mouth daily. 90 tablet 3   rosuvastatin (CRESTOR) 40 MG tablet Take 1 tablet (40 mg total) by mouth daily. 30 tablet 5   sacubitril-valsartan (ENTRESTO) 49-51 MG Take 1 tablet by mouth 2 (two) times daily. 60 tablet 2   spironolactone (ALDACTONE) 25 MG tablet Take 1 tablet (25 mg total) by mouth daily. 90 tablet 3   carvedilol (COREG) 25 MG tablet Take 1.5 tablets (37.5 mg total) by mouth 2 (two) times daily with a meal. 90 tablet 3   No current facility-administered medications for this visit.     Review of Systems  Please see the history of present illness.    (+) Waxing waning chest pain (+) Shortness of breath with exertion  All other systems reviewed and are otherwise negative except as noted above.  Physical Exam    Wt Readings from Last 3 Encounters:  01/20/22 243 lb (110.2 kg)  01/13/22 245 lb 9.6 oz (111.4 kg)  04/30/21 228 lb (103.4 kg)   VS: Vitals:   01/20/22 1125 01/20/22 1238  BP: (!) 172/102 (!) 158/98  Pulse:    SpO2:    ,Body mass index is 41.71 kg/m.  Constitutional:      Appearance: Healthy appearance. Not in distress.  Neck:     Vascular: JVD normal.  Pulmonary:     Effort: Pulmonary effort is normal.     Breath sounds: No wheezing. No rales. Diminished in the bases Cardiovascular:     Normal rate. Regular rhythm. Normal S1. Normal S2.      Murmurs: There is no murmur.  Edema:    Peripheral edema absent.  Abdominal:      Palpations: Abdomen is soft non tender. There is no hepatomegaly.  Skin:    General: Skin is warm and dry.  Neurological:     General: No focal deficit present.     Mental Status: Alert and oriented to person, place and time.     Cranial Nerves: Cranial nerves are intact.  EKG/LABS/Other Studies Reviewed    ECG personally reviewed by me today -sinus rhythm with a rate of 76 bpm and possible left atrial enlargement with TWI in leads aVL consistent with previous EKG.    Lab Results  Component Value Date   WBC 8.0 05/02/2021   HGB 11.1 (L) 05/02/2021   HCT 35.4 (L) 05/02/2021   MCV 86.3 05/02/2021  PLT 319 05/02/2021   Lab Results  Component Value Date   CREATININE 1.13 (H) 01/13/2022   BUN 16 01/13/2022   NA 138 01/13/2022   K 4.4 01/13/2022   CL 102 01/13/2022   CO2 22 01/13/2022   Lab Results  Component Value Date   ALT 10 04/29/2021   AST 18 04/29/2021   ALKPHOS 74 04/29/2021   BILITOT 0.7 04/29/2021   Lab Results  Component Value Date   CHOL 253 (H) 04/29/2021   HDL 62 04/29/2021   LDLCALC 170 (H) 04/29/2021   TRIG 107 04/29/2021   CHOLHDL 4.1 04/29/2021    Lab Results  Component Value Date   HGBA1C 5.4 04/29/2021    Assessment & Plan    1.  Chronic combined systolic and diastolic CHF: -TTE performed 3/31 showing EF of 45-50% and concentric LVH. -Today patient reports shortness of breath with exertion -Continue GDMT with spironolactone 25 mg daily, carvedilol 37.5 mg twice daily and we will increase Entresto to 49/51 mg twice daily -BMET today and in 1 week -Low sodium diet, fluid restriction <2L, and daily weights encouraged. Educated to contact our office for weight gain of 2 lbs overnight or 5 lbs in one week.   2.  Coronary artery disease: -s/p lateral STEMI moderate disease of left circumflex, RCA and diagonal -Today patient reports waxing waning chest pain that required nitroglycerin and 81 mg ASA for resolution. -She reported chest pain  today during visit an EKG was obtained that showed sinus rhythm. -We will add Imdur 30 mg daily with 30 mg as needed for breakthrough chest pain. -Continue GDMT with carvedilol 25 mg twice daily, Plavix 75 mg daily, Crestor 40 mg daily and ASA 81 mg daily  3.  Essential hypertension: -HYPERTENSION CONTROL Vitals:   01/20/22 1015 01/20/22 1125 01/20/22 1238  BP: (!) 162/102 (!) 172/102 (!) 158/98    The patient's blood pressure is elevated above target today.  In order to address the patient's elevated BP: A current anti-hypertensive medication was adjusted today.; Blood pressure will be monitored at home to determine if medication changes need to be made.     -Continue carvedilol 37.5 mg twice daily -We will continue to titrate blood pressure medications and patient encouraged to check blood pressures at home over the next 2 weeks.  4.  Hyperlipidemia: -We will obtain lipids and LFTs today -Continue Crestor 40 mg daily pending results of lab test.  Disposition: Follow-up with Lyn Records III, MD or APP in 2 months   Medication Adjustments/Labs and Tests Ordered: Current medicines are reviewed at length with the patient today.  Concerns regarding medicines are outlined above.   Signed, Napoleon Form, Leodis Rains, NP 01/20/2022, 12:38 PM [facility]  Note:  This document was prepared using Dragon voice recognition software and may include unintentional dictation errors.

## 2022-01-20 ENCOUNTER — Other Ambulatory Visit: Payer: Self-pay

## 2022-01-20 ENCOUNTER — Other Ambulatory Visit: Payer: Medicaid Other

## 2022-01-20 ENCOUNTER — Ambulatory Visit: Payer: Medicaid Other | Attending: Nurse Practitioner | Admitting: Nurse Practitioner

## 2022-01-20 ENCOUNTER — Encounter: Payer: Self-pay | Admitting: Nurse Practitioner

## 2022-01-20 VITALS — BP 158/98 | HR 75 | Ht 64.0 in | Wt 243.0 lb

## 2022-01-20 DIAGNOSIS — E785 Hyperlipidemia, unspecified: Secondary | ICD-10-CM

## 2022-01-20 DIAGNOSIS — I10 Essential (primary) hypertension: Secondary | ICD-10-CM

## 2022-01-20 DIAGNOSIS — I2510 Atherosclerotic heart disease of native coronary artery without angina pectoris: Secondary | ICD-10-CM | POA: Diagnosis not present

## 2022-01-20 DIAGNOSIS — I5040 Unspecified combined systolic (congestive) and diastolic (congestive) heart failure: Secondary | ICD-10-CM

## 2022-01-20 MED ORDER — CLONIDINE HCL 0.1 MG PO TABS
0.1000 mg | ORAL_TABLET | Freq: Once | ORAL | Status: AC
  Administered 2022-01-20: 0.1 mg via ORAL

## 2022-01-20 MED ORDER — CARVEDILOL 25 MG PO TABS: 37.5000 mg | ORAL_TABLET | Freq: Two times a day (BID) | ORAL | 3 refills | Status: AC

## 2022-01-20 MED ORDER — ISOSORBIDE MONONITRATE ER 30 MG PO TB24: 30.0000 mg | ORAL_TABLET | Freq: Every day | ORAL | 2 refills | Status: DC

## 2022-01-20 MED ORDER — FUROSEMIDE 20 MG PO TABS: 20.0000 mg | ORAL_TABLET | Freq: Every day | ORAL | 0 refills | Status: DC | PRN

## 2022-01-20 MED ORDER — ENTRESTO 49-51 MG PO TABS: 1.0000 | ORAL_TABLET | Freq: Two times a day (BID) | ORAL | 2 refills | Status: DC

## 2022-01-20 NOTE — Patient Instructions (Addendum)
Medication Instructions:  START Entresto 49/51mg  take 1 tablet twice a day  START Imdur 30mg  Take 1 tablet daily and can take an additional tablet for increased chest pain INCREASE Coreg to 37.5mg  Take 1 tablet twice a day  START Lasix 20mg  Daily as needed for shortness of breath *If you need a refill on your cardiac medications before your next appointment, please call your pharmacy*   Lab Work: TODAY-BMET, LIPID, & LFT If you have labs (blood work) drawn today and your tests are completely normal, you will receive your results only by: MyChart Message (if you have MyChart) OR A paper copy in the mail If you have any lab test that is abnormal or we need to change your treatment, we will call you to review the results.   Testing/Procedures: NONE ORDERED   Follow-Up: At [facility], you and your health needs are our priority.  As part of our continuing mission to provide you with exceptional heart care, we have created designated Provider Care Teams.  These Care Teams include your primary Cardiologist (physician) and Advanced Practice Providers (APPs -  Physician Assistants and Nurse Practitioners) who all work together to provide you with the care you need, when you need it.  We recommend signing up for the patient portal called "MyChart".  Sign up information is provided on this After Visit Summary.  MyChart is used to connect with patients for Virtual Visits (Telemedicine).  Patients are able to view lab/test results, encounter notes, upcoming appointments, etc.  Non-urgent messages can be sent to your provider as well.   To learn more about what you can do with MyChart, go to .    Your next appointment:   2 week(s)  The format for your next appointment:   In Person  Provider:   [facility], NP       Other Instructions   Important Information About Sugar

## 2022-01-20 NOTE — Progress Notes (Signed)
Patient was given clonidine 0.1 mg x 2 for elevated blood pressure

## 2022-01-21 LAB — LIPID PANEL
Chol/HDL Ratio: 3.1 ratio (ref 0.0–4.4)
Cholesterol, Total: 205 mg/dL — ABNORMAL HIGH (ref 100–199)
HDL: 67 mg/dL (ref >39)
LDL Chol Calc (NIH): 120 mg/dL — ABNORMAL HIGH (ref 0–99)
Triglycerides: 102 mg/dL (ref 0–149)
VLDL Cholesterol Cal: 18 mg/dL (ref 5–40)

## 2022-01-21 LAB — HEPATIC FUNCTION PANEL
ALT: 18 IU/L (ref 0–32)
AST: 25 IU/L (ref 0–40)
Albumin: 4.3 g/dL (ref 3.9–4.9)
Alkaline Phosphatase: 78 IU/L (ref 44–121)
Bilirubin Total: 0.5 mg/dL (ref 0.0–1.2)
Bilirubin, Direct: 0.14 mg/dL (ref 0.00–0.40)
Total Protein: 7.1 g/dL (ref 6.0–8.5)

## 2022-01-21 LAB — BASIC METABOLIC PANEL
BUN/Creatinine Ratio: 16 (ref 9–23)
BUN: 19 mg/dL (ref 6–24)
CO2: 24 mmol/L (ref 20–29)
Calcium: 9 mg/dL (ref 8.7–10.2)
Chloride: 99 mmol/L (ref 96–106)
Creatinine, Ser: 1.19 mg/dL — ABNORMAL HIGH (ref 0.57–1.00)
Glucose: 79 mg/dL (ref 70–99)
Potassium: 4.6 mmol/L (ref 3.5–5.2)
Sodium: 135 mmol/L (ref 134–144)
eGFR: 57 mL/min/{1.73_m2} — ABNORMAL LOW (ref >59)

## 2022-01-26 ENCOUNTER — Telehealth: Payer: Self-pay | Admitting: Interventional Cardiology

## 2022-01-26 DIAGNOSIS — E785 Hyperlipidemia, unspecified: Secondary | ICD-10-CM

## 2022-01-26 MED ORDER — EZETIMIBE 10 MG PO TABS: 10.0000 mg | ORAL_TABLET | Freq: Every day | ORAL | 3 refills | Status: AC

## 2022-01-26 NOTE — Telephone Encounter (Signed)
Received incoming call from patient.  Reviewed Dr. Michaelle Copas recommendations from most recent lipid panel.  Per Dr. Katrinka Blazing: Let the patient know the lipids are out of control. Add Zetia to further lower LDL and refer to Lipid clinic to consider PCSK-9.   Zetia sent to pharmacy of choice, referral to Lipid Clinic placed.  Patient verbalized understanding.

## 2022-01-26 NOTE — Telephone Encounter (Signed)
Spoke with patient and discussed lab results.  Patient agreeable to starting Zetia 10mg  QD, sent to [facility] pharmacy in [place].  Call ended in mid-conversation.  Called patient and left message informing her that Dr. Baldwin park also recommends she be referred to our Lipid Clinic to discuss other options for cholesterol management.  Referral placed, scheduler to call and arrange appointment with Pharm D.  Provided office number for callback if any questions.

## 2022-01-26 NOTE — Telephone Encounter (Signed)
-----   Message from Lyn Records, MD sent at 01/21/2022 12:38 PM EST ----- Let the patient know the lipids are out of control. Add Zetia to further lower LDL and refer to Lipid clinic to consider PCSK-9. A copy will be sent to Patrcia Dolly, DO

## 2022-01-28 ENCOUNTER — Telehealth: Payer: Self-pay

## 2022-01-28 NOTE — Telephone Encounter (Signed)
Wendy Hodge is also FDA approved for patients with heart failure with preserved ejection fraction. Should not need EF < 40% for insurance coverage. Would see if appeal can be done since her insurance company is not using appropriate FDA approved indications for their coverage criteria.

## 2022-01-28 NOTE — Telephone Encounter (Signed)
**Note De-Identified Kavina Cantave Obfuscation** Notice Date: 01/28/2022 PA #: [id number]  This Action will take effect on: 01/28/2022  [facility]  [address] [place], [place] [address]  We denied your request for:  Entresto Tab 49-51mg   Medical Necessity You have a type of long-term heart condition (chronic heart failure, New York Heart Association Class II-IV) with the left side of your heart pumping less than or equal to 40% (ejection fraction). The information provided does not show that you meet the criteria listed above. Please speak with your doctor about your choices.  Forwarding this message to Dr Katrinka Blazing and pharmacy for advisement to the pt.

## 2022-01-28 NOTE — Telephone Encounter (Signed)
**Note De-Identified Wendy Hodge Obfuscation** Entresto PA started through covermymeds. Key: [id number]

## 2022-01-28 NOTE — Telephone Encounter (Incomplete)
**Note De-Identified Waino Mounsey Obfuscation** I called [employer] community to attempt an appeal over the phone and was transferred multiple times and my calls were dropped X2.  I will complete an appeal form and will fax back to them.

## 2022-01-30 NOTE — Progress Notes (Unsigned)
Office Visit    Patient Name: Wendy Hodge Date of Encounter: 01/30/2022  PCP:  [facility]  Cardiologist:  Lesleigh Noe, MD  Advanced Practice Provider:  No care team member to display Electrophysiologist:  None   HPI    Wendy Hodge is a 46 y.o. female with past medical history of CAD status post lateral STEMI with moderate disease of left circumflex, RCA and diagonal chronic combined CHF, HTN, HLD, CHF, tobacco abuse, obesity, CKD stage IIIa who presents today for follow-up appointment.  She was last seen 01/20/2022 for shortness of breath and chest discomfort.  She was initially seen March 2023 for chest pain and found to have a lateral STEMI.  Blood pressures were elevated at 180/100 and nitro x 4 and 2 aspirin were given by EMS with no improvement of symptoms.  She underwent emergent cardiac cath that showed moderate disease in the mid RCA and mid circumflex with severe ostial stenosis of small ramus and occluded D2.  2D echo was completed showing EF of 45 to 50% with global LV hypokinesis, grade 1 DD and moderate concentric LVH.  GDMT was started and Lasix drip was initiated for diuresis.  Medical therapy was also initiated with high intensity statin and ASA.  Seen by Dr. Katrinka Blazing 01/13/2022 with poorly controlled blood pressure and shortness of breath.  She was reporting chest pain with increased ambulation.  During that visit further GDMT was initiated with Entresto and spironolactone.  Losartan was discontinued and patient will have further titration.  The patient presented 12/21 for a 1 week follow-up.  She had been experiencing continued chest discomfort and shortness of breath with activity.  Described pain as exertional travels to the shoulder and armpit.  During her visit then her blood pressure was extremely elevated with initial reading of 162/102 and second reading at 172/102.  Administered clonidine 0.1 mg x 2 and blood pressure reduced to  159/98.  Euvolemic on exam with heart rate 75 bpm.  She reports full compliance with her medications and denied any adverse reactions.  She reported using nitroglycerin daily as well as 81 mg of aspirin.  Reported nosebleeds with clopidogrel.  Further discussion of titration and increase medications to optimize GDMT.  Today, she***   Past Medical History    Past Medical History:  Diagnosis Date   CAD (coronary artery disease)    Chronic kidney disease, stage 3a (HCC)    Elevated lipoprotein(a)    Essential hypertension    Heart failure with mid-range ejection fraction (HFmEF) (HCC)    Hyperlipidemia    Hypothyroidism    Morbid obesity (HCC)    STEMI (ST elevation myocardial infarction) (HCC)    Substance abuse (HCC)    Past Surgical History:  Procedure Laterality Date   LEFT HEART CATH AND CORONARY ANGIOGRAPHY N/A 04/29/2021   Procedure: LEFT HEART CATH AND CORONARY ANGIOGRAPHY;  Surgeon: Kathleene Hazel, MD;  Location: [facility];  Service: Cardiovascular;  Laterality: N/A;    Allergies  Allergies  Allergen Reactions   Penicillins Hives and Rash   Sulfa Antibiotics Rash and Hives    Other reaction(s): Other (see comments) Makes her crazy    Latex Rash   Penicillin G Rash    EKGs/Labs/Other Studies Reviewed:   The following studies were reviewed today: ***  EKG:  EKG is *** ordered today.  The ekg ordered today demonstrates ***  Recent Labs: 04/29/2021: Magnesium 2.2; TSH 120.126  05/02/2021: Hemoglobin 11.1; Platelets 319 01/20/2022: ALT 18; BUN 19; Creatinine, Ser 1.19; Potassium 4.6; Sodium 135  Recent Lipid Panel    Component Value Date/Time   CHOL 205 (H) 01/20/2022 1243   TRIG 102 01/20/2022 1243   HDL 67 01/20/2022 1243   CHOLHDL 3.1 01/20/2022 1243   CHOLHDL 4.1 04/29/2021 0415   VLDL 21 04/29/2021 0415   LDLCALC 120 (H) 01/20/2022 1243    Risk Assessment/Calculations:  {Does this patient have ATRIAL FIBRILLATION?[phone]}  Home  Medications   No outpatient medications have been marked as taking for the 02/02/22 encounter (Appointment) with Sharlene Dory, PA-C.     Review of Systems   ***   All other systems reviewed and are otherwise negative except as noted above.  Physical Exam    VS:  There were no vitals taken for this visit. , BMI There is no height or weight on file to calculate BMI.  Wt Readings from Last 3 Encounters:  01/20/22 243 lb (110.2 kg)  01/13/22 245 lb 9.6 oz (111.4 kg)  04/30/21 228 lb (103.4 kg)     GEN: Well nourished, well developed, in no acute distress. HEENT: normal. Neck: Supple, no JVD, carotid bruits, or masses. Cardiac: ***RRR, no murmurs, rubs, or gallops. No clubbing, cyanosis, edema.  ***Radials/PT 2+ and equal bilaterally.  Respiratory:  ***Respirations regular and unlabored, clear to auscultation bilaterally. GI: Soft, nontender, nondistended. MS: No deformity or atrophy. Skin: Warm and dry, no rash. Neuro:  Strength and sensation are intact. Psych: Normal affect.  Assessment & Plan    Chronic combined systolic and diastolic CHF CAD Essential hypertension  No BP recorded.  {Refresh Note OR Click here to enter BP  :1}***      Disposition: Follow up {follow up:15908} with Lesleigh Noe, MD or APP.  Signed, Sharlene Dory, PA-C 01/30/2022, 4:37 PM [facility]

## 2022-02-01 NOTE — Telephone Encounter (Addendum)
**Note De-Identified Yedidya Duddy Obfuscation** I have completed a [facility] [employer] form for the pts [place], added an Astronomer and faxed all to [employer] [place].  Fax: Tx 'ok' Report CONE_EMAIL-to-Fax [place] Kretz, Mardene Celeste This message was sent Jamarea Selner [facility], a product from [employer] http://www.[employer].com/                    -------Fax Transmission Report-------  To:               Recipient at [phone] Subject:          Fw: [place] Result:           The transmission was successful. Explanation:      All Pages Ok Pages Sent:       3 Connect Time:     1 minutes, 59 seconds Transmit Time:    02/01/2022 07:27 Transfer Rate:    14400 Status Code:      0000 Retry Count:      0 Job Id:           [id number] Unique Id:        [id number] Fax Line:         34 Fax Server:       [email or web address]

## 2022-02-02 ENCOUNTER — Ambulatory Visit: Payer: Medicaid Other | Attending: Physician Assistant | Admitting: Physician Assistant

## 2022-02-02 ENCOUNTER — Encounter: Payer: Self-pay | Admitting: Physician Assistant

## 2022-02-02 VITALS — BP 156/86 | HR 77 | Ht 64.5 in | Wt 244.0 lb

## 2022-02-02 DIAGNOSIS — I10 Essential (primary) hypertension: Secondary | ICD-10-CM | POA: Diagnosis not present

## 2022-02-02 DIAGNOSIS — I25118 Atherosclerotic heart disease of native coronary artery with other forms of angina pectoris: Secondary | ICD-10-CM | POA: Diagnosis not present

## 2022-02-02 DIAGNOSIS — I5043 Acute on chronic combined systolic (congestive) and diastolic (congestive) heart failure: Secondary | ICD-10-CM | POA: Diagnosis not present

## 2022-02-02 MED ORDER — SACUBITRIL-VALSARTAN 97-103 MG PO TABS: 1.0000 | ORAL_TABLET | Freq: Two times a day (BID) | ORAL | 11 refills | Status: AC

## 2022-02-02 NOTE — Patient Instructions (Signed)
Medication Instructions:  1.Increase Entresto to 97-103 mg twice a day *If you need a refill on your cardiac medications before your next appointment, please call your pharmacy*   Lab Work: BMP and BNP today If you have labs (blood work) drawn today and your tests are completely normal, you will receive your results only by: [facility] (if you have MyChart) OR A paper copy in the mail If you have any lab test that is abnormal or we need to change your treatment, we will call you to review the results.   Follow-Up: At [facility], you and your health needs are our priority.  As part of our continuing mission to provide you with exceptional heart care, we have created designated Provider Care Teams.  These Care Teams include your primary Cardiologist (physician) and Advanced Practice Providers (APPs -  Physician Assistants and Nurse Practitioners) who all work together to provide you with the care you need, when you need it.  We recommend signing up for the patient portal called "MyChart".  Sign up information is provided on this After Visit Summary.  MyChart is used to connect with patients for Virtual Visits (Telemedicine).  Patients are able to view lab/test results, encounter notes, upcoming appointments, etc.  Non-urgent messages can be sent to your provider as well.   To learn more about what you can do with MyChart, go to [email or web address].    Your next appointment:   2-3 month(s)  The format for your next appointment:   In Person  Provider:   Dr Gasper Sells Other Instructions 1.Check your blood pressure daily, one hour after taking your morning medications and keep a log 2.Weigh yourself every morning after using the restroom, before breakfast and keep a log  Low-Sodium Eating Plan Sodium, which is an element that makes up salt, helps you maintain a healthy balance of fluids in your body. Too much sodium can increase your blood pressure and cause fluid and  waste to be held in your body. Your health care provider or dietitian may recommend following this plan if you have high blood pressure (hypertension), kidney disease, liver disease, or heart failure. Eating less sodium can help lower your blood pressure, reduce swelling, and protect your heart, liver, and kidneys. What are tips for following this plan? Reading food labels The Nutrition Facts label lists the amount of sodium in one serving of the food. If you eat more than one serving, you must multiply the listed amount of sodium by the number of servings. Choose foods with less than 140 mg of sodium per serving. Avoid foods with 300 mg of sodium or more per serving. Shopping  Look for lower-sodium products, often labeled as "low-sodium" or "no salt added." Always check the sodium content, even if foods are labeled as "unsalted" or "no salt added." Buy fresh foods. Avoid canned foods and pre-made or frozen meals. Avoid canned, cured, or processed meats. Buy breads that have less than 80 mg of sodium per slice. Cooking  Eat more home-cooked food and less restaurant, buffet, and fast food. Avoid adding salt when cooking. Use salt-free seasonings or herbs instead of table salt or sea salt. Check with your health care provider or pharmacist before using salt substitutes. Cook with plant-based oils, such as canola, sunflower, or olive oil. Meal planning When eating at a restaurant, ask that your food be prepared with less salt or no salt, if possible. Avoid dishes labeled as brined, pickled, cured, smoked, or made with soy sauce, miso,  or teriyaki sauce. Avoid foods that contain MSG (monosodium glutamate). MSG is sometimes added to [place] food, bouillon, and some canned foods. Make meals that can be grilled, baked, poached, roasted, or steamed. These are generally made with less sodium. General information Most people on this plan should limit their sodium intake to 1,500-2,000 mg (milligrams)  of sodium each day. What foods should I eat? Fruits Fresh, frozen, or canned fruit. Fruit juice. Vegetables Fresh or frozen vegetables. "No salt added" canned vegetables. "No salt added" tomato sauce and paste. Low-sodium or reduced-sodium tomato and vegetable juice. Grains Low-sodium cereals, including oats, puffed wheat and rice, and shredded wheat. Low-sodium crackers. Unsalted rice. Unsalted pasta. Low-sodium bread. Whole-grain breads and whole-grain pasta. Meats and other proteins Fresh or frozen (no salt added) meat, poultry, seafood, and fish. Low-sodium canned tuna and salmon. Unsalted nuts. Dried peas, beans, and lentils without added salt. Unsalted canned beans. Eggs. Unsalted nut butters. Dairy Milk. Soy milk. Cheese that is naturally low in sodium, such as ricotta cheese, fresh mozzarella, or Swiss cheese. Low-sodium or reduced-sodium cheese. Cream cheese. Yogurt. Seasonings and condiments Fresh and dried herbs and spices. Salt-free seasonings. Low-sodium mustard and ketchup. Sodium-free salad dressing. Sodium-free light mayonnaise. Fresh or refrigerated horseradish. Lemon juice. Vinegar. Other foods Homemade, reduced-sodium, or low-sodium soups. Unsalted popcorn and pretzels. Low-salt or salt-free chips. The items listed above may not be a complete list of foods and beverages you can eat. Contact a dietitian for more information. What foods should I avoid? Vegetables Sauerkraut, pickled vegetables, and relishes. Olives. [place] fries. Onion rings. Regular canned vegetables (not low-sodium or reduced-sodium). Regular canned tomato sauce and paste (not low-sodium or reduced-sodium). Regular tomato and vegetable juice (not low-sodium or reduced-sodium). Frozen vegetables in sauces. Grains Instant hot cereals. Bread stuffing, pancake, and biscuit mixes. Croutons. Seasoned rice or pasta mixes. Noodle soup cups. Boxed or frozen macaroni and cheese. Regular salted crackers. Self-rising  flour. Meats and other proteins Meat or fish that is salted, canned, smoked, spiced, or pickled. Precooked or cured meat, such as sausages or meat loaves. Berniece Salines. Ham. Pepperoni. Hot dogs. Corned beef. Chipped beef. Salt pork. Jerky. Pickled herring. Anchovies and sardines. Regular canned tuna. Salted nuts. Dairy Processed cheese and cheese spreads. Hard cheeses. Cheese curds. Blue cheese. Feta cheese. String cheese. Regular cottage cheese. Buttermilk. Canned milk. Fats and oils Salted butter. Regular margarine. Ghee. Bacon fat. Seasonings and condiments Onion salt, garlic salt, seasoned salt, table salt, and sea salt. Canned and packaged gravies. Worcestershire sauce. Tartar sauce. Barbecue sauce. Teriyaki sauce. Soy sauce, including reduced-sodium. Steak sauce. Fish sauce. Oyster sauce. Cocktail sauce. Horseradish that you find on the shelf. Regular ketchup and mustard. Meat flavorings and tenderizers. Bouillon cubes. Hot sauce. Pre-made or packaged marinades. Pre-made or packaged taco seasonings. Relishes. Regular salad dressings. Salsa. Other foods Salted popcorn and pretzels. Corn chips and puffs. Potato and tortilla chips. Canned or dried soups. Pizza. Frozen entrees and pot pies. The items listed above may not be a complete list of foods and beverages you should avoid. Contact a dietitian for more information. Summary Eating less sodium can help lower your blood pressure, reduce swelling, and protect your heart, liver, and kidneys. Most people on this plan should limit their sodium intake to 1,500-2,000 mg (milligrams) of sodium each day. Canned, boxed, and frozen foods are high in sodium. Restaurant foods, fast foods, and pizza are also very high in sodium. You also get sodium by adding salt to food. Try to cook at home, eat  more fresh fruits and vegetables, and eat less fast food and canned, processed, or prepared foods. This information is not intended to replace advice given to you by your  health care provider. Make sure you discuss any questions you have with your health care provider. Document Revised: 02/22/2019 Document Reviewed: 12/19/2018 Elsevier Patient Education  [place]

## 2022-02-03 LAB — PRO B NATRIURETIC PEPTIDE: NT-Pro BNP: 610 pg/mL — ABNORMAL HIGH (ref 0–249)

## 2022-02-03 LAB — BASIC METABOLIC PANEL
BUN/Creatinine Ratio: 17 (ref 9–23)
BUN: 22 mg/dL (ref 6–24)
CO2: 23 mmol/L (ref 20–29)
Calcium: 9.6 mg/dL (ref 8.7–10.2)
Chloride: 104 mmol/L (ref 96–106)
Creatinine, Ser: 1.26 mg/dL — ABNORMAL HIGH (ref 0.57–1.00)
Glucose: 96 mg/dL (ref 70–99)
Potassium: 4 mmol/L (ref 3.5–5.2)
Sodium: 139 mmol/L (ref 134–144)
eGFR: 54 mL/min/{1.73_m2} — ABNORMAL LOW (ref >59)

## 2022-02-08 NOTE — Telephone Encounter (Signed)
**Note De-Identified Denajah Farias Obfuscation** I called [employer] at [phone] to check the status of the pts Entresto appeal that I faxed to them om 02/01/2022..  I was transferred again multiple times until my call was dropped after more than 2 hours of off and on holding without getting assistance.  I will attempt to f/u again at a later time.

## 2022-03-07 ENCOUNTER — Ambulatory Visit: Payer: Medicaid Other | Attending: Pharmacist | Admitting: Pharmacist

## 2022-03-07 NOTE — Progress Notes (Deleted)
Patient ID: Wendy Hodge                 DOB: 02-25-1975                    MRN: [id number]     HPI: Wendy Hodge is a 47 y.o. female patient referred to lipid clinic by Dr Tamala Julian. 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 LDL elevated at 120 on rosuvastatin '40mg'$  daily in December. Ezetimibe was added and pt referred to PharmD to discuss [id number]. Most recently seen on 02/02/22 by APP. BP remained elevated at 156/86 and Entresto was increased to 97-'103mg'$  BID.  Weight and BP Needs to stop smoking Latex allergy go for praluent Ask FHx Hypertensive at last visit in Jan, entresto up  Does pt have insurance?  CHF meds: carvedilol 37.'5mg'$  BID, furosemide '20mg'$  daily, Imdur '30mg'$  daily, Entresto 97-'103mg'$  BID, spironolactone '25mg'$  daily  Needs Farxiga added and can up coreg to 50 BID Discuss repatha  Current Medications: rosuvastatin '40mg'$  daily, ezetimibe '10mg'$  daily Risk Factors: premature ASCVD, HTN, CKD, elevated Lp(a), tobacco use, obesity LDL goal: '55mg'$ /dL  Diet:   Exercise:   Family History: Not on file  Social History: Smoking 1 PPD since 89. Formerly used crystal meth.  Labs: 01/20/22: TC 205, TG 102, HDL 67, LDL 120 (rosuvastatin '40mg'$  daily)  Past Medical History:  Diagnosis Date   CAD (coronary artery disease)    Chronic kidney disease, stage 3a (HCC)    Elevated lipoprotein(a)    Essential hypertension    Heart failure with mid-range ejection fraction (HFmEF) (HCC)    Hyperlipidemia    Hypothyroidism    Morbid obesity ([place])    STEMI (ST elevation myocardial infarction) ([place])    Substance abuse ([place])     Current Outpatient Medications on File Prior to Visit  Medication Sig Dispense Refill   aspirin EC 81 MG tablet Take 1 tablet (81 mg total) by mouth daily. Swallow whole. 30 tablet 5   carvedilol (COREG) 25 MG tablet Take 1.5 tablets (37.5 mg total)  by mouth 2 (two) times daily with a meal. 90 tablet 3   clopidogrel (PLAVIX) 75 MG tablet Take 1 tablet (75 mg total) by mouth daily. 30 tablet 5   ezetimibe (ZETIA) 10 MG tablet Take 1 tablet (10 mg total) by mouth daily. 90 tablet 3   furosemide (LASIX) 20 MG tablet Take 1 tablet (20 mg total) by mouth daily as needed (shortness of breath). 30 tablet 0   isosorbide mononitrate (IMDUR) 30 MG 24 hr tablet Take 1 tablet (30 mg total) by mouth daily. Can take an additional tablet for chest pain 45 tablet 2   levothyroxine (SYNTHROID) 25 MCG tablet Take 1 tablet (25 mcg total) by mouth daily before breakfast. 30 tablet 1   nitroGLYCERIN (NITROSTAT) 0.4 MG SL tablet Place 1 tablet (0.4 mg total) under the tongue every 5 (five) minutes as needed for chest pain (up to 3 doses). (Patient taking differently: Place 0.4 mg under the tongue every 5 (five) minutes as needed for chest pain (up to 3 doses). Last taken Jan. 1 2024 x1) 25 tablet 3   pantoprazole (PROTONIX) 40 MG tablet Take 1 tablet (40 mg total) by mouth daily. 90 tablet 3   rosuvastatin (CRESTOR) 40 MG tablet Take 1 tablet (40 mg total) by mouth daily. 30 tablet 5   sacubitril-valsartan (ENTRESTO)  97-103 MG Take 1 tablet by mouth 2 (two) times daily. 60 tablet 11   spironolactone (ALDACTONE) 25 MG tablet Take 1 tablet (25 mg total) by mouth daily. 90 tablet 3   No current facility-administered medications on file prior to visit.    Allergies  Allergen Reactions   Penicillins Hives and Rash   Sulfa Antibiotics Rash and Hives    Other reaction(s): Other (see comments) Makes her crazy    Latex Rash   Penicillin G Rash    Assessment/Plan:  1. Hyperlipidemia -

## 2022-04-20 ENCOUNTER — Telehealth: Payer: Self-pay | Admitting: Physician Assistant

## 2022-04-20 NOTE — Telephone Encounter (Signed)
Received disability form from [employer].  I called patient who said that she would come in next week to sign releases and pay at which time we will give the form to [facility].

## 2022-04-25 NOTE — Telephone Encounter (Signed)
Called patient today and left message reminding her to come in and sign release form and pay.

## 2022-04-27 NOTE — Telephone Encounter (Signed)
Patient is returning call. Patient stated they needed [place] to send paperwork stating that the patient is unable to work. Patient would like [place] to fax the paperwork to [phone] attn to [facility]. Please advise.

## 2022-05-04 ENCOUNTER — Ambulatory Visit: Payer: Medicaid Other | Attending: Internal Medicine | Admitting: Internal Medicine

## 2022-05-04 NOTE — Progress Notes (Deleted)
Office Visit    Patient Name: Jamiee Espericueta Date of Encounter: 05/04/2022  PCP:  Pcp, No   [place]  Cardiologist:  Sinclair Grooms, MD (Inactive)  Advanced Practice Provider:  No care team member to display Electrophysiologist:  None   CC: Transition to new cardiologist  HPI    Bonda Lusby is a 47 y.o. female with past medical history of CAD status post lateral STEMI with moderate disease of left circumflex, RCA and diagonal chronic combined CHF, HTN, HLD, CHF, tobacco abuse, obesity, CKD stage IIIa. 2023: Lateral STEMI.  Medical management due to no clear target. 2024: Transitioning from Dr. Tamala Julian; increased GDMT  Patient notes that she is doing ***.   Since last visit notes *** . There are no*** interval hospital/ED visit.    No chest pain or pressure ***.  No SOB/DOE*** and no PND/Orthopnea***.  No weight gain or leg swelling***.  No palpitations or syncope ***.  Ambulatory blood pressure ***.   Past Medical History    Past Medical History:  Diagnosis Date   CAD (coronary artery disease)    Chronic kidney disease, stage 3a (HCC)    Elevated lipoprotein(a)    Essential hypertension    Heart failure with mid-range ejection fraction (HFmEF) (HCC)    Hyperlipidemia    Hypothyroidism    Morbid obesity (HCC)    STEMI (ST elevation myocardial infarction) ([place])    Substance abuse ([place])    Past Surgical History:  Procedure Laterality Date   LEFT HEART CATH AND CORONARY ANGIOGRAPHY N/A 04/29/2021   Procedure: LEFT HEART CATH AND CORONARY ANGIOGRAPHY;  Surgeon: Burnell Blanks, MD;  Location: [facility];  Service: Cardiovascular;  Laterality: N/A;    Allergies  Allergies  Allergen Reactions   Penicillins Hives and Rash   Sulfa Antibiotics Rash and Hives    Other reaction(s): Other (see comments) Makes her crazy    Latex Rash   Penicillin G Rash    EKGs/Labs/Other Studies Reviewed:   The following studies were  reviewed today:  EKG:  EKG is not ordered today.   Cardiac Studies & Procedures   CARDIAC CATHETERIZATION  CARDIAC CATHETERIZATION 04/29/2021  Narrative   RPAV lesion is 50% stenosed.   Mid Cx lesion is 50% stenosed.   Prox RCA lesion is 50% stenosed.   Ramus lesion is 80% stenosed.   1st Diag lesion is 60% stenosed.   2nd Diag lesion is 100% stenosed.  Large caliber LAD that courses to the apex with no obstructive disease. The small caliber first diagonal branch has moderate disease but excellent flow. There appears to be calcification in the area of a second diagonal branch which could represent flush occlusion of this vessel but no dye staining noted. The Circumflex gives off a small intermediate branch, a large obtuse marginal branch and then continues as a moderate caliber vessel in the AV groove. The intermediate branch has a moderately severe ostial stenosis but this vessel is too small for PCI. The obtuse marginal is patent and has no obstructive disease. The mid Circumflex has a moderate stenosis. The RCA is a large dominant vessel with moderate mid stenosis. The posterolateral artery and the PDA are patent. No obvious occlusions of the small distal branches. Elevated LVEDP Hypertensive urgency (LV190/51/34  AO 170/112)  Recommendations: Will admit to the ICU. No clear coronary culprit for her presentation. She does have moderate underlying CAD and is presenting with hypertensive urgency. She is mostly  pain free at the conclusion of the cath. Will continue ASA and Brilinta for now. Will begin metoprolol and high intensity statin. We have started an IV NTG infusion in the cath lab for BP control. Will add Lisinopril today and start IV Lasix. Echo will be arranged. I suspect that with better BP control and diuresis she will feel better.  Findings Coronary Findings Diagnostic  Dominance: Right  Left Anterior Descending Vessel is large.  First Diagonal Branch Vessel is small in  size. 1st Diag lesion is 60% stenosed.  Second Diagonal Branch Vessel is small in size. 2nd Diag lesion is 100% stenosed.  Ramus Intermedius Vessel is small. Ramus lesion is 80% stenosed.  Left Circumflex Vessel is large. Mid Cx lesion is 50% stenosed.  First Obtuse Marginal Branch Vessel is large in size.  Right Coronary Artery Vessel is large. Prox RCA lesion is 50% stenosed.  Right Posterior Atrioventricular Artery RPAV lesion is 50% stenosed.  Intervention  No interventions have been documented.     ECHOCARDIOGRAM  ECHOCARDIOGRAM COMPLETE 04/29/2021  Narrative ECHOCARDIOGRAM REPORT    Patient Name:   TASHAWNA CAPOZZA Date of Exam: 04/29/2021 Medical Rec #:  [id number]    Height:       65.0 in Accession #:    [id number]   Weight:       250.0 lb Date of Birth:  [birth year]    BSA:          2.175 m Patient Age:    47 years     BP:           160/106 mmHg Patient Gender: F            HR:           83 bpm. Exam Location:  Inpatient  Procedure: 2D Echo, Cardiac Doppler, Color Doppler and Strain Analysis  Indications:    CAD  History:        Patient has no prior history of Echocardiogram examinations.  Sonographer:    Luisa Hart RDCS Referring Phys: [place]   Sonographer Comments: Suboptimal parasternal window, suboptimal apical window, suboptimal subcostal window and patient is morbidly obese. Image acquisition challenging due to patient body habitus. IMPRESSIONS   1. Left ventricular ejection fraction, by estimation, is 45 to 50%. The left ventricle has mildly decreased function. The left ventricle demonstrates global hypokinesis. There is moderate concentric left ventricular hypertrophy. Left ventricular diastolic parameters are consistent with Grade I diastolic dysfunction (impaired relaxation). 2. Right ventricular systolic function is normal. The right ventricular size is normal. 3. The mitral valve is normal in structure. Mild mitral  valve regurgitation. No evidence of mitral stenosis. 4. The aortic valve is normal in structure. Aortic valve regurgitation is not visualized. No aortic stenosis is present. 5. The inferior vena cava is normal in size with greater than 50% respiratory variability, suggesting right atrial pressure of 3 mmHg.  Comparison(s): No prior Echocardiogram.  FINDINGS Left Ventricle: Left ventricular ejection fraction, by estimation, is 45 to 50%. The left ventricle has mildly decreased function. The left ventricle demonstrates global hypokinesis. Global longitudinal strain performed but not reported based on interpreter judgement due to suboptimal tracking. The left ventricular internal cavity size was normal in size. There is moderate concentric left ventricular hypertrophy. Left ventricular diastolic parameters are consistent with Grade I diastolic dysfunction (impaired relaxation).  Right Ventricle: The right ventricular size is normal. No increase in right ventricular wall thickness. Right ventricular systolic function is normal.  Left  Atrium: Left atrial size was normal in size.  Right Atrium: Right atrial size was normal in size.  Pericardium: There is no evidence of pericardial effusion.  Mitral Valve: The mitral valve is normal in structure. There is mild thickening of the mitral valve leaflet(s). Mild mitral valve regurgitation. No evidence of mitral valve stenosis. MV peak gradient, 7.6 mmHg. The mean mitral valve gradient is 3.0 mmHg.  Tricuspid Valve: The tricuspid valve is normal in structure. Tricuspid valve regurgitation is not demonstrated. No evidence of tricuspid stenosis.  Aortic Valve: The aortic valve is normal in structure. Aortic valve regurgitation is not visualized. No aortic stenosis is present. Aortic valve mean gradient measures 3.0 mmHg. Aortic valve peak gradient measures 5.1 mmHg. Aortic valve area, by VTI measures 2.68 cm.  Pulmonic Valve: The pulmonic valve was  normal in structure. Pulmonic valve regurgitation is not visualized. No evidence of pulmonic stenosis.  Aorta: The aortic root is normal in size and structure.  Venous: The inferior vena cava is normal in size with greater than 50% respiratory variability, suggesting right atrial pressure of 3 mmHg.  IAS/Shunts: No atrial level shunt detected by color flow Doppler.   LEFT VENTRICLE PLAX 2D LVIDd:         5.40 cm     Diastology LVIDs:         3.60 cm     LV e' medial:    5.22 cm/s LV PW:         1.40 cm     LV E/e' medial:  16.5 LV IVS:        1.40 cm     LV e' lateral:   4.13 cm/s LVOT diam:     2.50 cm     LV E/e' lateral: 20.8 LV SV:         51 LV SV Index:   23 LVOT Area:     4.91 cm  LV Volumes (MOD) LV vol d, MOD A2C: 55.6 ml LV vol d, MOD A4C: 80.0 ml LV vol s, MOD A2C: 33.8 ml LV vol s, MOD A4C: 46.7 ml LV SV MOD A2C:     21.8 ml LV SV MOD A4C:     80.0 ml LV SV MOD BP:      29.9 ml  RIGHT VENTRICLE RV Basal diam:  2.90 cm RV Mid diam:    2.80 cm RV S prime:     12.60 cm/s TAPSE (M-mode): 2.2 cm  LEFT ATRIUM             Index        RIGHT ATRIUM           Index LA diam:        4.50 cm 2.07 cm/m   RA Area:     12.00 cm LA Vol (A2C):   26.2 ml 12.05 ml/m  RA Volume:   26.10 ml  12.00 ml/m LA Vol (A4C):   59.3 ml 27.27 ml/m LA Biplane Vol: 39.2 ml 18.02 ml/m AORTIC VALVE                    PULMONIC VALVE AV Area (Vmax):    3.29 cm     PV Vmax:       1.22 m/s AV Area (Vmean):   3.04 cm     PV Vmean:      76.900 cm/s AV Area (VTI):     2.68 cm     PV VTI:  0.201 m AV Vmax:           113.00 cm/s  PV Peak grad:  6.0 mmHg AV Vmean:          72.600 cm/s  PV Mean grad:  3.0 mmHg AV VTI:            0.189 m AV Peak Grad:      5.1 mmHg AV Mean Grad:      3.0 mmHg LVOT Vmax:         75.70 cm/s LVOT Vmean:        45.000 cm/s LVOT VTI:          0.103 m LVOT/AV VTI ratio: 0.54  AORTA Ao Asc diam: 2.90 cm  MITRAL VALVE MV Area (PHT): 4.83 cm        SHUNTS MV Area VTI:   2.34 cm       Systemic VTI:  0.10 m MV Peak grad:  7.6 mmHg       Systemic Diam: 2.50 cm MV Mean grad:  3.0 mmHg MV Vmax:       1.38 m/s MV Vmean:      80.2 cm/s MV Decel Time: 157 msec MR Peak grad:    155.8 mmHg MR Mean grad:    101.0 mmHg MR Vmax:         624.00 cm/s MR Vmean:        471.0 cm/s MR PISA:         1.01 cm MR PISA Eff ROA: 5 mm MR PISA Radius:  0.40 cm MV E velocity: 85.90 cm/s MV A velocity: 129.00 cm/s MV E/A ratio:  0.67  Kardie Tobb DO Electronically signed by Berniece Salines DO Signature Date/Time: 04/29/2021/10:04:10 AM    Final                Recent Labs: 01/20/2022: ALT 18 02/02/2022: BUN 22; Creatinine, Ser 1.26; NT-Pro BNP 610; Potassium 4.0; Sodium 139  Recent Lipid Panel    Component Value Date/Time   CHOL 205 (H) 01/20/2022 1243   TRIG 102 01/20/2022 1243   HDL 67 01/20/2022 1243   CHOLHDL 3.1 01/20/2022 1243   CHOLHDL 4.1 04/29/2021 0415   VLDL 21 04/29/2021 0415   LDLCALC 120 (H) 01/20/2022 1243   Home Medications   No outpatient medications have been marked as taking for the 05/04/22 encounter (Appointment) with Werner Lean, MD.     Review of Systems      All other systems reviewed and are otherwise negative except as noted above.  Physical Exam    VS:  There were no vitals taken for this visit. , BMI There is no height or weight on file to calculate BMI.  Wt Readings from Last 3 Encounters:  02/02/22 244 lb (110.7 kg)  01/20/22 243 lb (110.2 kg)  01/13/22 245 lb 9.6 oz (111.4 kg)    Gen: *** distress, *** obese/well nourished/malnourished   Neck: No JVD, *** carotid bruit Ears: *** Frank Sign Cardiac: No Rubs or Gallops, *** Murmur, ***cardia, *** radial pulses Respiratory: Clear to auscultation bilaterally, *** effort, ***  respiratory rate GI: Soft, nontender, non-distended *** MS: No *** edema; *** moves all extremities Integument: Skin feels *** Neuro:  At time of evaluation, alert  and oriented to person/place/time/situation *** Psych: Normal affect, patient feels ***   Assessment & Plan    Chronic combined systolic and diastolic CHF HTN CKD Stage IIIa - NYHA class ***, Stage ***, ***volemic, etiology from *** -  Diuretic regimen: Lasix/torsemide/Bumex +/- metolazone ***  -elevate legs, she cannot wear compression due to fragments of shrapnel in her left lower leg  CAD HLD - symptomatic on *** with stable/unstable angina on therapy *** - anatomy: *** - continue ASA 81 mg; Continue *** until *** - continue statin, goal LDL < 55 - sending Lp(a) *** - continue BB - continue nitrates; *** PDEi - continue ACEi - discussed cardiac rehab  Discussed Shared Decision Making Using the ISCHEMIA SDM and [place] Angina Questionnaire-7 SAQ Survival: *** QOL: ***  Tobacco Abuse     Rudean Haskell, MD [facility]  [address], [place] [address] [phone]  8:00 AM

## 2022-05-05 ENCOUNTER — Encounter: Payer: Self-pay | Admitting: Internal Medicine

## 2022-05-23 NOTE — Telephone Encounter (Signed)
Called patient and spoke with them about the disability form and that [facility] wanted her to have a follow up appointment before she fills out the document. Patient agreed and made appointment on 4/29. Documents are at the front desk and patient needs to sign form as well.

## 2022-05-23 NOTE — Telephone Encounter (Signed)
Patient was last seen by me January 2024. She has not been in the hospital since her MI over a year ago (March 2023). She missed a follow-up with [place] (should have been seen last month for a 2-3 month follow-up).   Disability paperwork will not be filled out at this time.   Patient can make an appointment and we can further discuss why she feels like she needs disability.  Sharlene Dory, PA-C

## 2022-05-29 NOTE — Progress Notes (Unsigned)
Office Visit    Patient Name: Wendy Hodge Date of Encounter: 05/30/2022  PCP:  Aviva Kluver   [facility]  Cardiologist:  Meriam Sprague, MD  Advanced Practice Provider:  No care team member to display Electrophysiologist:  None   HPI    Wendy Hodge is a 47 y.o. female with past medical history of CAD status post lateral STEMI with moderate disease of left circumflex, RCA and diagonal chronic combined CHF, HTN, HLD, CHF, tobacco abuse, obesity, CKD stage IIIa who presents today for follow-up appointment.  She was last seen 01/20/2022 for shortness of breath and chest discomfort.  She was initially seen March 2023 for chest pain and found to have a lateral STEMI.  Blood pressures were elevated at 180/100 and nitro x 4 and 2 aspirin were given by EMS with no improvement of symptoms.  She underwent emergent cardiac cath that showed moderate disease in the mid RCA and mid circumflex with severe ostial stenosis of small ramus and occluded D2.  2D echo was completed showing EF of 45 to 50% with global LV hypokinesis, grade 1 DD and moderate concentric LVH.  GDMT was started and Lasix drip was initiated for diuresis.  Medical therapy was also initiated with high intensity statin and ASA.  Seen by Dr. Katrinka Blazing 01/13/2022 with poorly controlled blood pressure and shortness of breath.  She was reporting chest pain with increased ambulation.  During that visit further GDMT was initiated with Entresto and spironolactone.  Losartan was discontinued and patient will have further titration.  The patient presented 12/21 for a 1 week follow-up.  She had been experiencing continued chest discomfort and shortness of breath with activity.  Described pain as exertional travels to the shoulder and armpit.  During her visit then her blood pressure was extremely elevated with initial reading of 162/102 and second reading at 172/102.  Administered clonidine 0.1 mg x 2 and blood pressure reduced to  159/98.  Euvolemic on exam with heart rate 75 bpm.  She reports full compliance with her medications and denied any adverse reactions.  She reported using nitroglycerin daily as well as 81 mg of aspirin.  Reported nosebleeds with clopidogrel.  Further discussion of titration and increase medications to optimize GDMT.  She was seen by me 02/02/2022,  she tells me she is short of breath with exertion. She feels like she still has some fluid on board. She tells me her weight is up and down when she has checked. She does feel like her lower ext edema has improved since march. Otherwise doing well today. Her BP is elevated today and has been elevated her last few visits. She is taking lasix daily and has been for the last few days. I have encouraged her to continue taking it daily until we can get her back closer to her baseline weight and her SOB improves.  Today, she tells me that BP is staying elevated and was up to 190s. She has had to take her nitro a few times this week for chest pain but on further questioning she is having more head pressure and vision changes which seemingly is from high BP. She feels like she cannot do anything due to these symptoms. She tells me she has been feeling terrible since her stent. She is tired and out of breath. She is having less swelling in her legs luckily. We discussed repeating an echo. She has lost about 20  lbs and is trying to loose  more. She does tell me that she has trouble eating due to acid reflux and she cannot stop her Protonix and its the only thing that works for her.We discussed transitioning her to a new cardiologist since she was a patient of Dr. Katrinka Blazing who has retired.  No edema, orthopnea, PND. Reports no palpitations.    Past Medical History    Past Medical History:  Diagnosis Date   CAD (coronary artery disease)    Chronic kidney disease, stage 3a (HCC)    Elevated lipoprotein(a)    Essential hypertension    Heart failure with mid-range ejection  fraction (HFmEF) (HCC)    Hyperlipidemia    Hypothyroidism    Morbid obesity (HCC)    STEMI (ST elevation myocardial infarction) (HCC)    Substance abuse (HCC)    Past Surgical History:  Procedure Laterality Date   LEFT HEART CATH AND CORONARY ANGIOGRAPHY N/A 04/29/2021   Procedure: LEFT HEART CATH AND CORONARY ANGIOGRAPHY;  Surgeon: Kathleene Hazel, MD;  Location: [facility];  Service: Cardiovascular;  Laterality: N/A;    Allergies  Allergies  Allergen Reactions   Penicillins Hives and Rash   Sulfa Antibiotics Rash and Hives    Other reaction(s): Other (see comments) Makes her crazy    Latex Rash   Penicillin G Rash    EKGs/Labs/Other Studies Reviewed:   The following studies were reviewed today:  Cardiac cath 04/29/2021  Left Anterior Descending  Vessel is large.    First Diagonal Branch  Vessel is small in size.  1st Diag lesion is 60% stenosed.    Second Diagonal Branch  Vessel is small in size.  2nd Diag lesion is 100% stenosed.    Ramus Intermedius  Vessel is small.  Ramus lesion is 80% stenosed.    Left Circumflex  Vessel is large.  Mid Cx lesion is 50% stenosed.    First Obtuse Marginal Branch  Vessel is large in size.    Right Coronary Artery  Vessel is large.  Prox RCA lesion is 50% stenosed.    Right Posterior Atrioventricular Artery  RPAV lesion is 50% stenosed.    Intervention   No interventions have been documented.   Coronary Diagrams  Diagnostic Dominance: Right  Intervention   EKG:  EKG is not ordered today.   Recent Labs: 01/20/2022: ALT 18 02/02/2022: BUN 22; Creatinine, Ser 1.26; NT-Pro BNP 610; Potassium 4.0; Sodium 139  Recent Lipid Panel    Component Value Date/Time   CHOL 205 (H) 01/20/2022 1243   TRIG 102 01/20/2022 1243   HDL 67 01/20/2022 1243   CHOLHDL 3.1 01/20/2022 1243   CHOLHDL 4.1 04/29/2021 0415   VLDL 21 04/29/2021 0415   LDLCALC 120 (H) 01/20/2022 1243   Home Medications    Current Meds  Medication Sig   aspirin EC 81 MG tablet Take 1 tablet (81 mg total) by mouth daily. Swallow whole.   carvedilol (COREG) 25 MG tablet Take 1.5 tablets (37.5 mg total) by mouth 2 (two) times daily with a meal.   clopidogrel (PLAVIX) 75 MG tablet Take 1 tablet (75 mg total) by mouth daily.   ezetimibe (ZETIA) 10 MG tablet Take 1 tablet (10 mg total) by mouth daily.   isosorbide mononitrate (IMDUR) 30 MG 24 hr tablet Take 1 tablet (30 mg total) by mouth daily. Can take an additional tablet for chest pain   levothyroxine (SYNTHROID) 25 MCG tablet Take 1 tablet (25 mcg total) by mouth daily before breakfast.   nitroGLYCERIN (  NITROSTAT) 0.4 MG SL tablet Place 1 tablet (0.4 mg total) under the tongue every 5 (five) minutes as needed for chest pain (up to 3 doses). (Patient taking differently: Place 0.4 mg under the tongue every 5 (five) minutes as needed for chest pain (up to 3 doses). Last taken Jan. 1 2024 x1)   pantoprazole (PROTONIX) 40 MG tablet Take 1 tablet (40 mg total) by mouth daily.   rosuvastatin (CRESTOR) 40 MG tablet Take 1 tablet (40 mg total) by mouth daily.   sacubitril-valsartan (ENTRESTO) 97-103 MG Take 1 tablet by mouth 2 (two) times daily.   spironolactone (ALDACTONE) 25 MG tablet Take 1 tablet (25 mg total) by mouth daily.     Review of Systems      All other systems reviewed and are otherwise negative except as noted above.  Physical Exam    VS:  BP (!) 172/98   Pulse 90   Ht 5' 4.5" (1.638 m)   Wt 225 lb 12.8 oz (102.4 kg)   SpO2 98%   BMI 38.16 kg/m  , BMI Body mass index is 38.16 kg/m.  Wt Readings from Last 3 Encounters:  05/30/22 225 lb 12.8 oz (102.4 kg)  02/02/22 244 lb (110.7 kg)  01/20/22 243 lb (110.2 kg)     GEN: Well nourished, well developed, in no acute distress. HEENT: normal. Neck: Supple, no JVD, carotid bruits, or masses. Cardiac: RRR, no murmurs, rubs, or gallops. No clubbing, cyanosis, 1+ nonpitting edema.  Radials/PT 2+ and  equal bilaterally.  Respiratory:  Respirations regular and unlabored, clear to auscultation bilaterally. GI: Soft, nontender, nondistended. MS: No deformity or atrophy. Skin: Warm and dry, no rash. Neuro:  Strength and sensation are intact. Psych: Normal affect.  Assessment & Plan    Chronic combined systolic and diastolic CHF -we have been working on titration her GDMT -some SOB but swelling is better  -continue lasix as needed, prescription filled today -continue spirolactone -continue Entresto for better BP control -daily weights in the morning before breakfast and after using the bathroom -elevate legs, she cannot wear compression due to fragments of shrapnel in her left lower leg -update echo  CAD -now she is having chest pain that started this week -anginal equivalent under left arm and up into her neck which she has not had -update echo -cardiac cath reviewed from 03/2021 -will increase Imdur -continue current medicaitons   Essential hypertension -continue Entresto, she is now at max dose -increase Imdur to 60mg  daily -continue coreg 37.5mg  BID  -continue plavix and asa -continue as needed nitro -echo ordered today -continue spirolactone 25mg  daily -added jardiance/farxiga  -please keep track of your BP an hour after morning medications -elevated this morning 172/98    Disposition: Follow up earliest availability with Meriam Sprague, MD or APP.  Signed, Sharlene Dory, PA-C 05/30/2022, 9:52 AM [facility]

## 2022-05-30 ENCOUNTER — Ambulatory Visit: Payer: Medicaid Other | Attending: Internal Medicine | Admitting: Physician Assistant

## 2022-05-30 ENCOUNTER — Encounter: Payer: Self-pay | Admitting: Physician Assistant

## 2022-05-30 VITALS — BP 172/98 | HR 90 | Ht 64.5 in | Wt 225.8 lb

## 2022-05-30 DIAGNOSIS — I5040 Unspecified combined systolic (congestive) and diastolic (congestive) heart failure: Secondary | ICD-10-CM

## 2022-05-30 DIAGNOSIS — I2510 Atherosclerotic heart disease of native coronary artery without angina pectoris: Secondary | ICD-10-CM | POA: Diagnosis not present

## 2022-05-30 DIAGNOSIS — I10 Essential (primary) hypertension: Secondary | ICD-10-CM | POA: Diagnosis not present

## 2022-05-30 MED ORDER — ISOSORBIDE MONONITRATE ER 60 MG PO TB24: 60.0000 mg | ORAL_TABLET | Freq: Every day | ORAL | 2 refills | Status: AC

## 2022-05-30 MED ORDER — FUROSEMIDE 20 MG PO TABS: 20.0000 mg | ORAL_TABLET | Freq: Every day | ORAL | 0 refills | Status: AC | PRN

## 2022-05-30 NOTE — Patient Instructions (Addendum)
Medication Instructions:   START TAKING : IMDUR 60 MG ONCE A DAY   *If you need a refill on your cardiac medications before your next appointment, please call your pharmacy*   Lab Work: RETURN FOR FASTING  LFT AND LIPIDS  ON FOLLOW UP APPOINTMENT    If you have labs (blood work) drawn today and your tests are completely normal, you will receive your results only by: MyChart Message (if you have MyChart) OR A paper copy in the mail If you have any lab test that is abnormal or we need to change your treatment, we will call you to review the results.   Testing/Procedures: Your physician has requested that you have an echocardiogram. Echocardiography is a painless test that uses sound waves to create images of your heart. It provides your doctor with information about the size and shape of your heart and how well your heart's chambers and valves are working. This procedure takes approximately one hour. There are no restrictions for this procedure. Please do NOT wear cologne, perfume, aftershave, or lotions (deodorant is allowed). Please arrive 15 minutes prior to your appointment time.    Follow-Up: At [facility], you and your health needs are our priority.  As part of our continuing mission to provide you with exceptional heart care, we have created designated Provider Care Teams.  These Care Teams include your primary Cardiologist (physician) and Advanced Practice Providers (APPs -  Physician Assistants and Nurse Practitioners) who all work together to provide you with the care you need, when you need it.  We recommend signing up for the patient portal called "MyChart".  Sign up information is provided on this After Visit Summary.  MyChart is used to connect with patients for Virtual Visits (Telemedicine).  Patients are able to view lab/test results, encounter notes, upcoming appointments, etc.  Non-urgent messages can be sent to your provider as well.   To learn more about what  you can do with MyChart, go to [email or web address].    Your next appointment:   4-6  week(s)  Provider:  Jari Favre, PA-C    Other Instructions  Heart-Healthy Eating Plan Eating a healthy diet is important for the health of your heart. A heart-healthy eating plan includes: Eating less unhealthy fats. Eating more healthy fats. Eating less salt in your food. Salt is also called sodium. Making other changes in your diet. Talk with your doctor or a diet specialist (dietitian) to create an eating plan that is right for you. What is my plan? Your doctor may recommend an eating plan that includes: Total fat: ______% or less of total calories a day. Saturated fat: ______% or less of total calories a day. Cholesterol: less than _________mg a day. Sodium: less than _________mg a day. What are tips for following this plan? Cooking Avoid frying your food. Try to bake, boil, grill, or broil it instead. You can also reduce fat by: Removing the skin from poultry. Removing all visible fats from meats. Steaming vegetables in water or broth. Meal planning  At meals, divide your plate into four equal parts: Fill one-half of your plate with vegetables and green salads. Fill one-fourth of your plate with whole grains. Fill one-fourth of your plate with lean protein foods. Eat 2-4 cups of vegetables per day. One cup of vegetables is: 1 cup (91 g) broccoli or cauliflower florets. 2 medium carrots. 1 large bell pepper. 1 large sweet potato. 1 large tomato. 1 medium white potato. 2 cups (150 g) raw  leafy greens. Eat 1-2 cups of fruit per day. One cup of fruit is: 1 small apple 1 large banana 1 cup (237 g) mixed fruit, 1 large orange,  cup (82 g) dried fruit, 1 cup (240 mL) 100% fruit juice. Eat more foods that have soluble fiber. These are apples, broccoli, carrots, beans, peas, and barley. Try to get 20-30 g of fiber per day. Eat 4-5 servings of nuts, legumes, and seeds per  week: 1 serving of dried beans or legumes equals  cup (90 g) cooked. 1 serving of nuts is  oz (12 almonds, 24 pistachios, or 7 walnut halves). 1 serving of seeds equals  oz (8 g). General information Eat more home-cooked food. Eat less restaurant, buffet, and fast food. Limit or avoid alcohol. Limit foods that are high in starch and sugar. Avoid fried foods. Lose weight if you are overweight. Keep track of how much salt (sodium) you eat. This is important if you have high blood pressure. Ask your doctor to tell you more about this. Try to add vegetarian meals each week. Fats Choose healthy fats. These include olive oil and canola oil, flaxseeds, walnuts, almonds, and seeds. Eat more omega-3 fats. These include salmon, mackerel, sardines, tuna, flaxseed oil, and ground flaxseeds. Try to eat fish at least 2 times each week. Check food labels. Avoid foods with trans fats or high amounts of saturated fat. Limit saturated fats. These are often found in animal products, such as meats, butter, and cream. These are also found in plant foods, such as palm oil, palm kernel oil, and coconut oil. Avoid foods with partially hydrogenated oils in them. These have trans fats. Examples are stick margarine, some tub margarines, cookies, crackers, and other baked goods. What foods should I eat? Fruits All fresh, canned (in natural juice), or frozen fruits. Vegetables Fresh or frozen vegetables (raw, steamed, roasted, or grilled). Green salads. Grains Most grains. Choose whole wheat and whole grains most of the time. Rice and pasta, including brown rice and pastas made with whole wheat. Meats and other proteins Lean, well-trimmed beef, veal, pork, and lamb. Chicken and [place] without skin. All fish and shellfish. Wild duck, rabbit, pheasant, and venison. Egg whites or low-cholesterol egg substitutes. Dried beans, peas, lentils, and tofu. Seeds and most nuts. Dairy Low-fat or nonfat cheeses, including  ricotta and mozzarella. Skim or 1% milk that is liquid, powdered, or evaporated. Buttermilk that is made with low-fat milk. Nonfat or low-fat yogurt. Fats and oils Non-hydrogenated (trans-free) margarines. Vegetable oils, including soybean, sesame, sunflower, olive, peanut, safflower, corn, canola, and cottonseed. Salad dressings or mayonnaise made with a vegetable oil. Beverages Mineral water. Coffee and tea. Diet carbonated beverages. Sweets and desserts Sherbet, gelatin, and fruit ice. Small amounts of dark chocolate. Limit all sweets and desserts. Seasonings and condiments All seasonings and condiments. The items listed above may not be a complete list of foods and drinks you can eat. Contact a dietitian for more options. What foods should I avoid? Fruits Canned fruit in heavy syrup. Fruit in cream or butter sauce. Fried fruit. Limit coconut. Vegetables Vegetables cooked in cheese, cream, or butter sauce. Fried vegetables. Grains Breads that are made with saturated or trans fats, oils, or whole milk. Croissants. Sweet rolls. Donuts. High-fat crackers, such as cheese crackers. Meats and other proteins Fatty meats, such as hot dogs, ribs, sausage, bacon, rib-eye roast or steak. High-fat deli meats, such as salami and bologna. Caviar. Domestic duck and goose. Organ meats, such as liver. Dairy Cream,  sour cream, cream cheese, and creamed cottage cheese. Whole-milk cheeses. Whole or 2% milk that is liquid, evaporated, or condensed. Whole buttermilk. Cream sauce or high-fat cheese sauce. Yogurt that is made from whole milk. Fats and oils Meat fat, or shortening. Cocoa butter, hydrogenated oils, palm oil, coconut oil, palm kernel oil. Solid fats and shortenings, including bacon fat, salt pork, lard, and butter. Nondairy cream substitutes. Salad dressings with cheese or sour cream. Beverages Regular sodas and juice drinks with added sugar. Sweets and desserts Frosting. Pudding. Cookies. Cakes.  Pies. Milk chocolate or white chocolate. Buttered syrups. Full-fat ice cream or ice cream drinks. The items listed above may not be a complete list of foods and drinks to avoid. Contact a dietitian for more information. Summary Heart-healthy meal planning includes eating less unhealthy fats, eating more healthy fats, and making other changes in your diet. Eat a balanced diet. This includes fruits and vegetables, low-fat or nonfat dairy, lean protein, nuts and legumes, whole grains, and heart-healthy oils and fats. This information is not intended to replace advice given to you by your health care provider. Make sure you discuss any questions you have with your health care provider. Document Revised: 02/22/2021 Document Reviewed: 02/22/2021 Elsevier Patient Education  2023 Elsevier Inc.   Low-Sodium Eating Plan Sodium, which is an element that makes up salt, helps you maintain a healthy balance of fluids in your body. Too much sodium can increase your blood pressure and cause fluid and waste to be held in your body. Your health care provider or dietitian may recommend following this plan if you have high blood pressure (hypertension), kidney disease, liver disease, or heart failure. Eating less sodium can help lower your blood pressure, reduce swelling, and protect your heart, liver, and kidneys. What are tips for following this plan? Reading food labels The Nutrition Facts label lists the amount of sodium in one serving of the food. If you eat more than one serving, you must multiply the listed amount of sodium by the number of servings. Choose foods with less than 140 mg of sodium per serving. Avoid foods with 300 mg of sodium or more per serving. Shopping  Look for lower-sodium products, often labeled as "low-sodium" or "no salt added." Always check the sodium content, even if foods are labeled as "unsalted" or "no salt added." Buy fresh foods. Avoid canned foods and pre-made or frozen  meals. Avoid canned, cured, or processed meats. Buy breads that have less than 80 mg of sodium per slice. Cooking  Eat more home-cooked food and less restaurant, buffet, and fast food. Avoid adding salt when cooking. Use salt-free seasonings or herbs instead of table salt or sea salt. Check with your health care provider or pharmacist before using salt substitutes. Cook with plant-based oils, such as canola, sunflower, or olive oil. Meal planning When eating at a restaurant, ask that your food be prepared with less salt or no salt, if possible. Avoid dishes labeled as brined, pickled, cured, smoked, or made with soy sauce, miso, or teriyaki sauce. Avoid foods that contain MSG (monosodium glutamate). MSG is sometimes added to [place] food, bouillon, and some canned foods. Make meals that can be grilled, baked, poached, roasted, or steamed. These are generally made with less sodium. General information Most people on this plan should limit their sodium intake to 1,500-2,000 mg (milligrams) of sodium each day. What foods should I eat? Fruits Fresh, frozen, or canned fruit. Fruit juice. Vegetables Fresh or frozen vegetables. "No salt added" canned  vegetables. "No salt added" tomato sauce and paste. Low-sodium or reduced-sodium tomato and vegetable juice. Grains Low-sodium cereals, including oats, puffed wheat and rice, and shredded wheat. Low-sodium crackers. Unsalted rice. Unsalted pasta. Low-sodium bread. Whole-grain breads and whole-grain pasta. Meats and other proteins Fresh or frozen (no salt added) meat, poultry, seafood, and fish. Low-sodium canned tuna and salmon. Unsalted nuts. Dried peas, beans, and lentils without added salt. Unsalted canned beans. Eggs. Unsalted nut butters. Dairy Milk. Soy milk. Cheese that is naturally low in sodium, such as ricotta cheese, fresh mozzarella, or Swiss cheese. Low-sodium or reduced-sodium cheese. Cream cheese. Yogurt. Seasonings and condiments Fresh  and dried herbs and spices. Salt-free seasonings. Low-sodium mustard and ketchup. Sodium-free salad dressing. Sodium-free light mayonnaise. Fresh or refrigerated horseradish. Lemon juice. Vinegar. Other foods Homemade, reduced-sodium, or low-sodium soups. Unsalted popcorn and pretzels. Low-salt or salt-free chips. The items listed above may not be a complete list of foods and beverages you can eat. Contact a dietitian for more information. What foods should I avoid? Vegetables Sauerkraut, pickled vegetables, and relishes. Olives. [place] fries. Onion rings. Regular canned vegetables (not low-sodium or reduced-sodium). Regular canned tomato sauce and paste (not low-sodium or reduced-sodium). Regular tomato and vegetable juice (not low-sodium or reduced-sodium). Frozen vegetables in sauces. Grains Instant hot cereals. Bread stuffing, pancake, and biscuit mixes. Croutons. Seasoned rice or pasta mixes. Noodle soup cups. Boxed or frozen macaroni and cheese. Regular salted crackers. Self-rising flour. Meats and other proteins Meat or fish that is salted, canned, smoked, spiced, or pickled. Precooked or cured meat, such as sausages or meat loaves. Tomasa Blase. Ham. Pepperoni. Hot dogs. Corned beef. Chipped beef. Salt pork. Jerky. Pickled herring. Anchovies and sardines. Regular canned tuna. Salted nuts. Dairy Processed cheese and cheese spreads. Hard cheeses. Cheese curds. Blue cheese. Feta cheese. String cheese. Regular cottage cheese. Buttermilk. Canned milk. Fats and oils Salted butter. Regular margarine. Ghee. Bacon fat. Seasonings and condiments Onion salt, garlic salt, seasoned salt, table salt, and sea salt. Canned and packaged gravies. Worcestershire sauce. Tartar sauce. Barbecue sauce. Teriyaki sauce. Soy sauce, including reduced-sodium. Steak sauce. Fish sauce. Oyster sauce. Cocktail sauce. Horseradish that you find on the shelf. Regular ketchup and mustard. Meat flavorings and tenderizers. Bouillon  cubes. Hot sauce. Pre-made or packaged marinades. Pre-made or packaged taco seasonings. Relishes. Regular salad dressings. Salsa. Other foods Salted popcorn and pretzels. Corn chips and puffs. Potato and tortilla chips. Canned or dried soups. Pizza. Frozen entrees and pot pies. The items listed above may not be a complete list of foods and beverages you should avoid. Contact a dietitian for more information. Summary Eating less sodium can help lower your blood pressure, reduce swelling, and protect your heart, liver, and kidneys. Most people on this plan should limit their sodium intake to 1,500-2,000 mg (milligrams) of sodium each day. Canned, boxed, and frozen foods are high in sodium. Restaurant foods, fast foods, and pizza are also very high in sodium. You also get sodium by adding salt to food. Try to cook at home, eat more fresh fruits and vegetables, and eat less fast food and canned, processed, or prepared foods. This information is not intended to replace advice given to you by your health care provider. Make sure you discuss any questions you have with your health care provider. Document Revised: 12/24/2018 Document Reviewed: 12/19/2018 Elsevier Patient Education  2023 [employer].

## 2022-06-02 NOTE — Progress Notes (Signed)
LMOM for pt to return call. 

## 2022-06-20 NOTE — Telephone Encounter (Signed)
Patient decided that she did not want the form to be completed.  She did express that she could not afford to pay the $29 forms fee. Julian Hy brought the form back up to front desk and I scanned the incomplete form to her documents in Epic.

## 2022-06-29 ENCOUNTER — Ambulatory Visit: Payer: Medicaid Other

## 2022-06-29 ENCOUNTER — Ambulatory Visit: Payer: Medicaid Other | Attending: Physician Assistant

## 2022-06-29 ENCOUNTER — Encounter (HOSPITAL_COMMUNITY): Payer: Self-pay | Admitting: Physician Assistant

## 2022-07-14 NOTE — Progress Notes (Deleted)
Office Visit    Patient Name: Wendy Hodge Date of Encounter: 07/14/2022  PCP:  Aviva Kluver   [facility]  Cardiologist:  Meriam Sprague, MD  Advanced Practice Provider:  No care team member to display Electrophysiologist:  None   HPI    Wendy Hodge is a 47 y.o. female with past medical history of CAD status post lateral STEMI with moderate disease of left circumflex, RCA and diagonal chronic combined CHF, HTN, HLD, CHF, tobacco abuse, obesity, CKD stage IIIa who presents today for follow-up appointment.  She was last seen 01/20/2022 for shortness of breath and chest discomfort.  She was initially seen March 2023 for chest pain and found to have a lateral STEMI.  Blood pressures were elevated at 180/100 and nitro x 4 and 2 aspirin were given by EMS with no improvement of symptoms.  She underwent emergent cardiac cath that showed moderate disease in the mid RCA and mid circumflex with severe ostial stenosis of small ramus and occluded D2.  2D echo was completed showing EF of 45 to 50% with global LV hypokinesis, grade 1 DD and moderate concentric LVH.  GDMT was started and Lasix drip was initiated for diuresis.  Medical therapy was also initiated with high intensity statin and ASA.  Seen by Dr. Katrinka Blazing 01/13/2022 with poorly controlled blood pressure and shortness of breath.  She was reporting chest pain with increased ambulation.  During that visit further GDMT was initiated with Entresto and spironolactone.  Losartan was discontinued and patient will have further titration.  The patient presented 12/21 for a 1 week follow-up.  She had been experiencing continued chest discomfort and shortness of breath with activity.  Described pain as exertional travels to the shoulder and armpit.  During her visit then her blood pressure was extremely elevated with initial reading of 162/102 and second reading at 172/102.  Administered clonidine 0.1 mg x 2 and blood pressure reduced to  159/98.  Euvolemic on exam with heart rate 75 bpm.  She reports full compliance with her medications and denied any adverse reactions.  She reported using nitroglycerin daily as well as 81 mg of aspirin.  Reported nosebleeds with clopidogrel.  Further discussion of titration and increase medications to optimize GDMT.  She was seen by me 02/02/2022,  she tells me she is short of breath with exertion. She feels like she still has some fluid on board. She tells me her weight is up and down when she has checked. She does feel like her lower ext edema has improved since march. Otherwise doing well today. Her BP is elevated today and has been elevated her last few visits. She is taking lasix daily and has been for the last few days. I have encouraged her to continue taking it daily until we can get her back closer to her baseline weight and her SOB improves.  She was seen by me 05/30/2022, she tells me that BP is staying elevated and was up to 190s. She has had to take her nitro a few times this week for chest pain but on further questioning she is having more head pressure and vision changes which seemingly is from high BP. She feels like she cannot do anything due to these symptoms. She tells me she has been feeling terrible since her stent. She is tired and out of breath. She is having less swelling in her legs luckily. We discussed repeating an echo. She has lost about 20  lbs  and is trying to loose more. She does tell me that she has trouble eating due to acid reflux and she cannot stop her Protonix and its the only thing that works for her.We discussed transitioning her to a new cardiologist since she was a patient of Dr. Katrinka Blazing who has retired.  Today, she ***  Past Medical History    Past Medical History:  Diagnosis Date   CAD (coronary artery disease)    Chronic kidney disease, stage 3a (HCC)    Elevated lipoprotein(a)    Essential hypertension    Heart failure with mid-range ejection fraction (HFmEF)  (HCC)    Hyperlipidemia    Hypothyroidism    Morbid obesity (HCC)    STEMI (ST elevation myocardial infarction) (HCC)    Substance abuse (HCC)    Past Surgical History:  Procedure Laterality Date   LEFT HEART CATH AND CORONARY ANGIOGRAPHY N/A 04/29/2021   Procedure: LEFT HEART CATH AND CORONARY ANGIOGRAPHY;  Surgeon: Kathleene Hazel, MD;  Location: [facility];  Service: Cardiovascular;  Laterality: N/A;    Allergies  Allergies  Allergen Reactions   Penicillins Hives and Rash   Sulfa Antibiotics Rash and Hives    Other reaction(s): Other (see comments) Makes her crazy    Latex Rash   Penicillin G Rash    EKGs/Labs/Other Studies Reviewed:   The following studies were reviewed today:  Cardiac cath 04/29/2021  Left Anterior Descending  Vessel is large.    First Diagonal Branch  Vessel is small in size.  1st Diag lesion is 60% stenosed.    Second Diagonal Branch  Vessel is small in size.  2nd Diag lesion is 100% stenosed.    Ramus Intermedius  Vessel is small.  Ramus lesion is 80% stenosed.    Left Circumflex  Vessel is large.  Mid Cx lesion is 50% stenosed.    First Obtuse Marginal Branch  Vessel is large in size.    Right Coronary Artery  Vessel is large.  Prox RCA lesion is 50% stenosed.    Right Posterior Atrioventricular Artery  RPAV lesion is 50% stenosed.    Intervention   No interventions have been documented.   Coronary Diagrams  Diagnostic Dominance: Right  Intervention   EKG:  EKG is not ordered today.   Recent Labs: 01/20/2022: ALT 18 02/02/2022: BUN 22; Creatinine, Ser 1.26; NT-Pro BNP 610; Potassium 4.0; Sodium 139  Recent Lipid Panel    Component Value Date/Time   CHOL 205 (H) 01/20/2022 1243   TRIG 102 01/20/2022 1243   HDL 67 01/20/2022 1243   CHOLHDL 3.1 01/20/2022 1243   CHOLHDL 4.1 04/29/2021 0415   VLDL 21 04/29/2021 0415   LDLCALC 120 (H) 01/20/2022 1243   Home Medications   No outpatient  medications have been marked as taking for the 07/15/22 encounter (Appointment) with Sharlene Dory, PA-C.     Review of Systems      All other systems reviewed and are otherwise negative except as noted above.  Physical Exam    VS:  There were no vitals taken for this visit. , BMI There is no height or weight on file to calculate BMI.  Wt Readings from Last 3 Encounters:  05/30/22 225 lb 12.8 oz (102.4 kg)  02/02/22 244 lb (110.7 kg)  01/20/22 243 lb (110.2 kg)     GEN: Well nourished, well developed, in no acute distress. HEENT: normal. Neck: Supple, no JVD, carotid bruits, or masses. Cardiac: RRR, no murmurs, rubs,  or gallops. No clubbing, cyanosis, 1+ nonpitting edema.  Radials/PT 2+ and equal bilaterally.  Respiratory:  Respirations regular and unlabored, clear to auscultation bilaterally. GI: Soft, nontender, nondistended. MS: No deformity or atrophy. Skin: Warm and dry, no rash. Neuro:  Strength and sensation are intact. Psych: Normal affect.  Assessment & Plan    Chronic combined systolic and diastolic CHF -we have been working on titration her GDMT -some SOB but swelling is better  -continue lasix as needed, prescription filled today -continue spirolactone -continue Entresto for better BP control -daily weights in the morning before breakfast and after using the bathroom -elevate legs, she cannot wear compression due to fragments of shrapnel in her left lower leg -update echo  CAD -now she is having chest pain that started this week -anginal equivalent under left arm and up into her neck which she has not had -update echo -cardiac cath reviewed from 03/2021 -will increase Imdur -continue current medicaitons   Essential hypertension -continue Entresto, she is now at max dose -increase Imdur to 60mg  daily -continue coreg 37.5mg  BID  -continue plavix and asa -continue as needed nitro -echo ordered today -continue spirolactone 25mg  daily -added  jardiance/farxiga  -please keep track of your BP an hour after morning medications -elevated this morning 172/98    Disposition: Follow up earliest availability with Meriam Sprague, MD or APP.  Signed, Sharlene Dory, PA-C 07/14/2022, 9:51 PM [facility]

## 2022-07-15 ENCOUNTER — Encounter: Payer: Self-pay | Admitting: Physician Assistant

## 2022-07-15 ENCOUNTER — Ambulatory Visit: Payer: Medicaid Other | Attending: Physician Assistant | Admitting: Physician Assistant

## 2022-07-15 DIAGNOSIS — I10 Essential (primary) hypertension: Secondary | ICD-10-CM

## 2022-07-15 DIAGNOSIS — I2510 Atherosclerotic heart disease of native coronary artery without angina pectoris: Secondary | ICD-10-CM

## 2022-07-15 DIAGNOSIS — I5040 Unspecified combined systolic (congestive) and diastolic (congestive) heart failure: Secondary | ICD-10-CM

## 2024-01-04 IMAGING — DX DG CHEST 1V PORT
1 series · 1 of 1 positions shown · non-contrast
Comparison: 07/16/2007.

CLINICAL DATA: 45-year-old female with chest pain. Pain under arm.
Diaphoresis.

EXAM:
PORTABLE CHEST 1 VIEW

[chest]
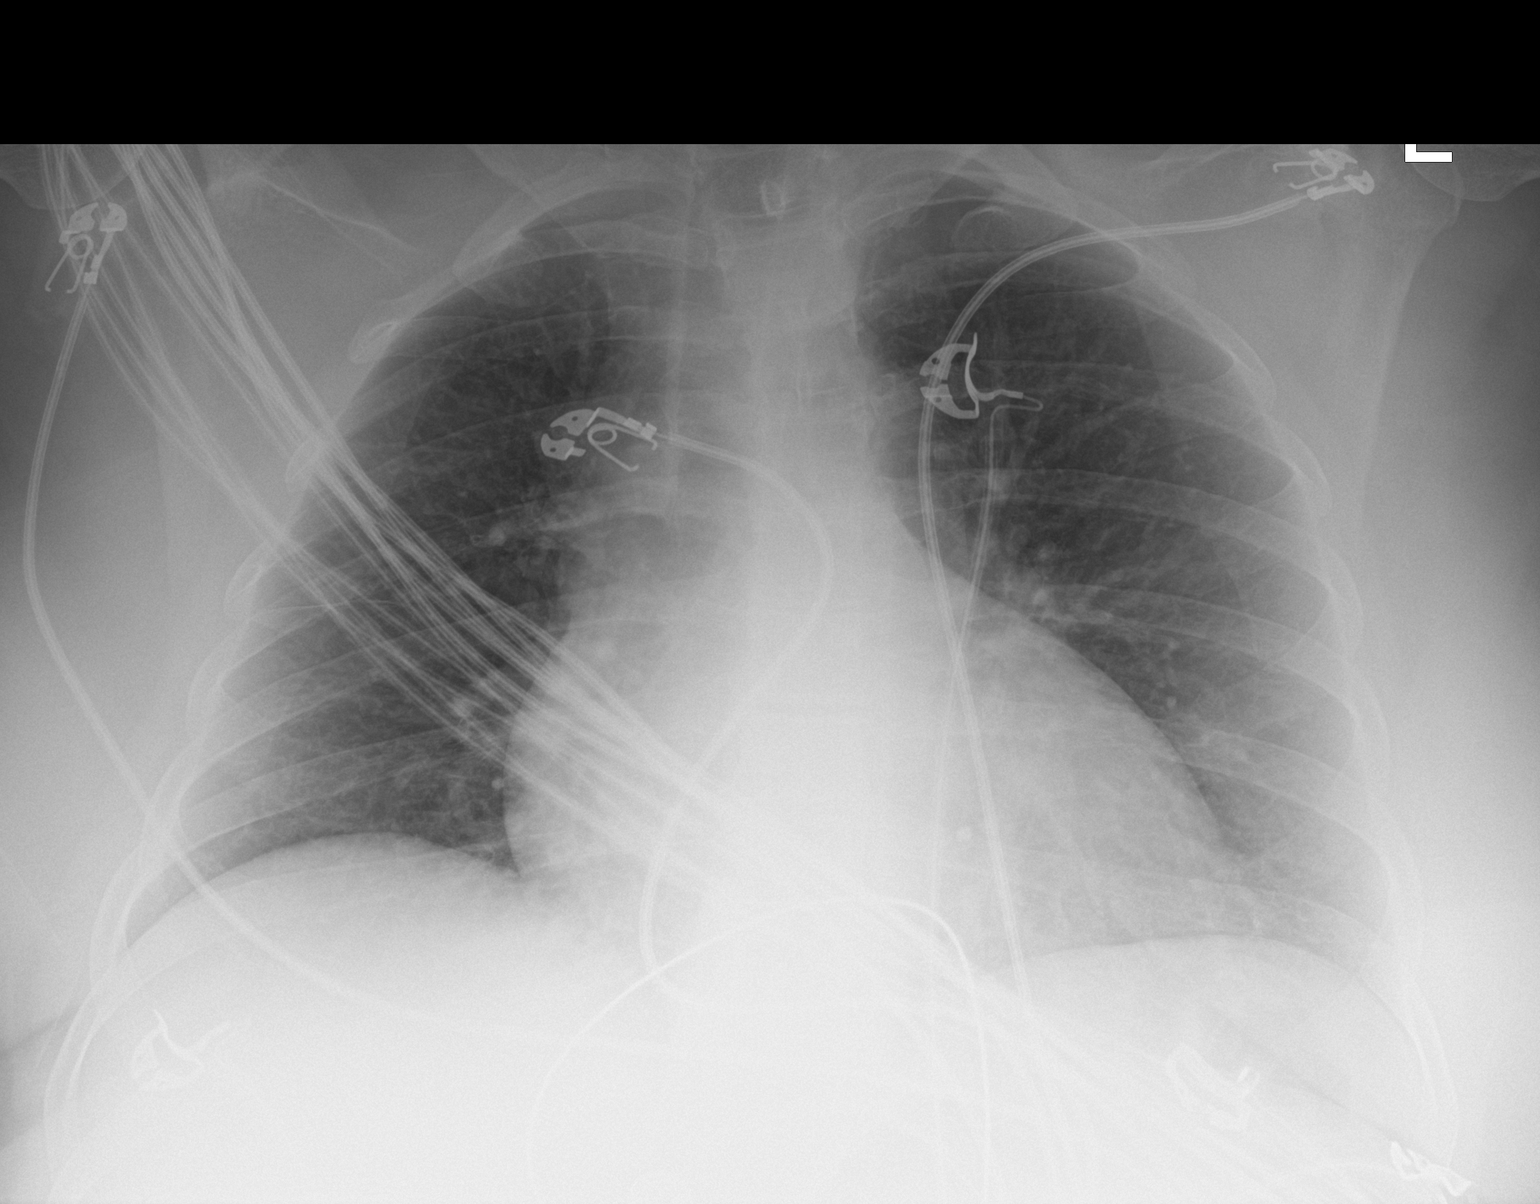

[1 of 1 positions shown; findings below may reference images not displayed]

FINDINGS: Portable AP semi upright view at 6404 hours. Mildly lower lung
volumes. Mediastinal contours remain within normal limits.
Visualized tracheal air column is within normal limits. Allowing for
portable technique the lungs are clear. No pneumothorax or pleural
effusion. No osseous abnormality identified. Negative visible bowel
gas.
IMPRESSION: Negative portable chest.
